# Patient Record
Sex: Male | Born: 1980 | Race: Black or African American | Hispanic: No | Marital: Single | Smoking: Current every day smoker
Health system: Southern US, Community
[De-identification: ages and names within clinical notes are randomized; demographics above are authoritative.]

## PROBLEM LIST (undated history)

## (undated) DIAGNOSIS — I319 Disease of pericardium, unspecified: Secondary | ICD-10-CM

## (undated) DIAGNOSIS — F1910 Other psychoactive substance abuse, uncomplicated: Secondary | ICD-10-CM

## (undated) HISTORY — PX: FETAL SURGERY FOR CONGENITAL HERNIA: SHX1618

## (undated) HISTORY — DX: Other psychoactive substance abuse, uncomplicated: F19.10

---

## 2001-08-23 ENCOUNTER — Emergency Department (HOSPITAL_COMMUNITY): Admission: EM | Admit: 2001-08-23 | Discharge: 2001-08-23 | Payer: Self-pay

## 2005-03-07 ENCOUNTER — Emergency Department (HOSPITAL_COMMUNITY): Admission: EM | Admit: 2005-03-07 | Discharge: 2005-03-07 | Payer: Self-pay | Admitting: Emergency Medicine

## 2006-11-14 ENCOUNTER — Emergency Department (HOSPITAL_COMMUNITY): Admission: EM | Admit: 2006-11-14 | Discharge: 2006-11-14 | Payer: Self-pay | Admitting: Emergency Medicine

## 2007-02-25 ENCOUNTER — Emergency Department (HOSPITAL_COMMUNITY): Admission: EM | Admit: 2007-02-25 | Discharge: 2007-02-25 | Payer: Self-pay | Admitting: Emergency Medicine

## 2007-09-01 ENCOUNTER — Ambulatory Visit: Payer: Self-pay | Admitting: Internal Medicine

## 2007-11-19 ENCOUNTER — Ambulatory Visit: Payer: Self-pay | Admitting: *Deleted

## 2007-12-06 ENCOUNTER — Emergency Department (HOSPITAL_COMMUNITY): Admission: EM | Admit: 2007-12-06 | Discharge: 2007-12-06 | Payer: Self-pay | Admitting: Emergency Medicine

## 2007-12-08 ENCOUNTER — Emergency Department (HOSPITAL_COMMUNITY): Admission: EM | Admit: 2007-12-08 | Discharge: 2007-12-08 | Payer: Self-pay | Admitting: Emergency Medicine

## 2007-12-09 ENCOUNTER — Emergency Department (HOSPITAL_COMMUNITY): Admission: EM | Admit: 2007-12-09 | Discharge: 2007-12-09 | Payer: Self-pay | Admitting: Emergency Medicine

## 2009-06-28 ENCOUNTER — Emergency Department (HOSPITAL_COMMUNITY): Admission: EM | Admit: 2009-06-28 | Discharge: 2009-06-28 | Payer: Self-pay | Admitting: Emergency Medicine

## 2011-02-01 LAB — DIFFERENTIAL
Basophils Absolute: 0 10*3/uL (ref 0.0–0.1)
Basophils Relative: 0 % (ref 0–1)
Eosinophils Absolute: 0 10*3/uL (ref 0.0–0.7)
Eosinophils Relative: 1 % (ref 0–5)
Lymphocytes Relative: 31 % (ref 12–46)
Lymphs Abs: 1.4 10*3/uL (ref 0.7–4.0)
Monocytes Absolute: 0.5 10*3/uL (ref 0.1–1.0)
Monocytes Relative: 12 % (ref 3–12)
Neutro Abs: 2.5 10*3/uL (ref 1.7–7.7)
Neutrophils Relative %: 55 % (ref 43–77)

## 2011-02-01 LAB — CBC
HCT: 42.2 % (ref 39.0–52.0)
Hemoglobin: 13.9 g/dL (ref 13.0–17.0)
MCHC: 33 g/dL (ref 30.0–36.0)
MCV: 81.8 fL (ref 78.0–100.0)
Platelets: 158 10*3/uL (ref 150–400)
RBC: 5.16 MIL/uL (ref 4.22–5.81)
RDW: 15.3 % (ref 11.5–15.5)
WBC: 4.4 10*3/uL (ref 4.0–10.5)

## 2011-02-01 LAB — RAPID STREP SCREEN (MED CTR MEBANE ONLY): Streptococcus, Group A Screen (Direct): NEGATIVE

## 2011-02-01 LAB — MONONUCLEOSIS SCREEN: Mono Screen: NEGATIVE

## 2011-06-25 ENCOUNTER — Emergency Department (HOSPITAL_COMMUNITY)
Admission: EM | Admit: 2011-06-25 | Discharge: 2011-06-25 | Disposition: A | Discharge status: Home / Self Care | Payer: Self-pay | Attending: Emergency Medicine | Admitting: Emergency Medicine

## 2011-06-25 ENCOUNTER — Emergency Department (HOSPITAL_COMMUNITY): Payer: Self-pay

## 2011-06-25 DIAGNOSIS — R002 Palpitations: Secondary | ICD-10-CM | POA: Insufficient documentation

## 2011-06-25 DIAGNOSIS — F17200 Nicotine dependence, unspecified, uncomplicated: Secondary | ICD-10-CM | POA: Insufficient documentation

## 2011-06-25 DIAGNOSIS — R079 Chest pain, unspecified: Secondary | ICD-10-CM | POA: Insufficient documentation

## 2012-05-17 ENCOUNTER — Emergency Department (HOSPITAL_COMMUNITY)
Admission: EM | Admit: 2012-05-17 | Discharge: 2012-05-17 | Disposition: A | Discharge status: Home / Self Care | Payer: Self-pay | Attending: Emergency Medicine | Admitting: Emergency Medicine

## 2012-05-17 ENCOUNTER — Emergency Department (HOSPITAL_COMMUNITY): Payer: Self-pay

## 2012-05-17 ENCOUNTER — Encounter (HOSPITAL_COMMUNITY): Payer: Self-pay | Admitting: Emergency Medicine

## 2012-05-17 DIAGNOSIS — Y9289 Other specified places as the place of occurrence of the external cause: Secondary | ICD-10-CM | POA: Insufficient documentation

## 2012-05-17 DIAGNOSIS — X500XXA Overexertion from strenuous movement or load, initial encounter: Secondary | ICD-10-CM | POA: Insufficient documentation

## 2012-05-17 DIAGNOSIS — Y9389 Activity, other specified: Secondary | ICD-10-CM | POA: Insufficient documentation

## 2012-05-17 DIAGNOSIS — IMO0002 Reserved for concepts with insufficient information to code with codable children: Secondary | ICD-10-CM | POA: Insufficient documentation

## 2012-05-17 DIAGNOSIS — Y998 Other external cause status: Secondary | ICD-10-CM | POA: Insufficient documentation

## 2012-05-17 MED ORDER — CYCLOBENZAPRINE HCL 10 MG PO TABS: 10.0000 mg | ORAL_TABLET | Freq: Two times a day (BID) | ORAL | Status: AC | PRN

## 2012-05-17 MED ORDER — HYDROCODONE-ACETAMINOPHEN 5-500 MG PO TABS: 1.0000 | ORAL_TABLET | Freq: Four times a day (QID) | ORAL | Status: AC | PRN

## 2012-05-17 NOTE — ED Provider Notes (Signed)
Medical screening examination/treatment/procedure(s) were performed by non-physician practitioner and as supervising physician I was immediately available for consultation/collaboration.  Chenika Nevils, MD 05/17/12 0724 

## 2012-05-17 NOTE — ED Notes (Signed)
The patient is AOx4 and comfortable with his discharge instructions. 

## 2012-05-17 NOTE — ED Notes (Signed)
Patient transported to X-ray 

## 2012-05-17 NOTE — ED Notes (Addendum)
Patient complaining of constant upper back pain since Friday; patient denies injury.  Patient ambulatory in triage; able to move all extremities without difficulty.  Patient denies chest pain, but states that pain worsens upon deep inspiration.  Denies recent cold symptoms.

## 2012-05-17 NOTE — ED Provider Notes (Signed)
History     CSN: [id number]  Arrival date & time 05/17/12  0504   First MD Initiated Contact with Patient 05/17/12 [phone]      Chief Complaint  Patient presents with  . Back Pain    (Consider location/radiation/quality/duration/timing/severity/associated sxs/prior treatment) HPI  And the patient presents to the emergency department with complaints of thoracic back pain. 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 He states he uses a lift truck all day on Thursday when he woke up in the morning on Friday  was very sore. She denies feeling any pain the day before he denies having a history of back problems. He denies having any numbness or tingling in all 4 extremities. He denies having bowel or urinary incontinence. He denies having had a common duct pain, fevers, nausea, vomiting, chills, weakness. History Tony sleeping at night and had to be comfortable because of the pain. He does admit that when he takes a large breath that causes pain in his back and he is concerned about this because his smoker.VSS/NAD   History reviewed. No pertinent past medical history.  Past Surgical History  Procedure Date  . Fetal surgery for congenital hernia     History reviewed. No pertinent family history.  History  Substance Use Topics  . Smoking status: Never Smoker   . Smokeless tobacco: Not on file  . Alcohol Use: No      Review of Systems   HEENT: denies blurry vision or change in hearing PULMONARY: Denies difficulty breathing and SOB CARDIAC: denies chest pain or heart palpitations MUSCULOSKELETAL:  denies being unable to ambulate ABDOMEN AL: denies abdominal pain GU: denies loss of bowel or urinary control NEURO: denies numbness and tingling in extremities SKIN: no new rashes PSYCH: patient denies anxiety or depression. NECK: Pt denies having neck pain     Allergies  Review of patient's allergies indicates  no known allergies.  Home Medications   Current Outpatient Rx  Name Route Sig Dispense Refill  . IBUPROFEN 600 MG PO TABS Oral Take 1,200 mg by mouth once.    . CYCLOBENZAPRINE HCL 10 MG PO TABS Oral Take 1 tablet (10 mg total) by mouth 2 (two) times daily as needed for muscle spasms. 20 tablet 0  . HYDROCODONE-ACETAMINOPHEN 5-500 MG PO TABS Oral Take 1 tablet by mouth every 6 (six) hours as needed for pain. 10 tablet 0    BP 130/86  Pulse 66  Temp 97.6 F (36.4 C) (Oral)  Resp 18  SpO2 100%  Physical Exam  Nursing note and vitals reviewed. Constitutional: He appears well-developed and well-nourished. No distress.  HENT:  Head: Normocephalic and atraumatic.  Eyes: Pupils are equal, round, and reactive to light.  Neck: Normal range of motion. Neck supple.  Cardiovascular: Normal rate and regular rhythm.   Pulmonary/Chest: Effort normal.  Abdominal: Soft.  Musculoskeletal:       Thoracic back: He exhibits tenderness (mild tenderness). He exhibits normal range of motion, no bony tenderness and no swelling.        Equal strength to bilateral lower and upper extremities. Neurosensory  function adequate to both legs. Skin color is normal. Skin is warm and moist. I see no step off deformity, no bony tenderness. Pt is able to ambulate without limp. Pain is relieved when sitting in certain positions. ROM is decreased due to pain. No crepitus, laceration, effusion, swelling.  Pulses are normal  Neurological: He is alert.  Skin: Skin is warm and dry.    ED Course  Procedures (including critical care time)  Labs Reviewed - No data to display Dg Chest 2 View  05/17/2012  *RADIOLOGY REPORT*  Clinical Data: Chest wall pain and upper back pain since Friday.  CHEST - 2 VIEW  Comparison: 06/25/2011  Findings: Normal heart size and pulmonary vascularity.  No focal airspace consolidation in the lungs.  No blunting of costophrenic angles.  No pneumothorax.  Visualized ribs appear intact.  No  paraspinal soft tissue swelling.  No significant change since previous study.  IMPRESSION: No evidence of active pulmonary disease.  Original Report Authenticated By: Marlon Pel, M.D.     1. Back sprain or strain       MDM  Patient with back pain. No neurological deficits. Patient is ambulatory. No warning symptoms of back pain including: loss of bowel or bladder control, night sweats, waking from sleep with back pain, unexplained fevers or weight loss, h/o cancer, IVDU, recent trauma. No concern for cauda equina, epidural abscess, or other serious cause of back pain. Conservative measures such as rest, ice/heat and pain medicine indicated with PCP follow-up if no improvement with conservative management.           Dorthula Matas, PA 05/17/12 [phone]

## 2013-01-24 ENCOUNTER — Emergency Department (HOSPITAL_COMMUNITY): Payer: 59

## 2013-01-24 ENCOUNTER — Emergency Department (HOSPITAL_COMMUNITY)
Admission: EM | Admit: 2013-01-24 | Discharge: 2013-01-24 | Disposition: A | Discharge status: Home / Self Care | Payer: 59 | Attending: Emergency Medicine | Admitting: Emergency Medicine

## 2013-01-24 ENCOUNTER — Encounter (HOSPITAL_COMMUNITY): Payer: Self-pay | Admitting: Emergency Medicine

## 2013-01-24 DIAGNOSIS — I309 Acute pericarditis, unspecified: Secondary | ICD-10-CM | POA: Insufficient documentation

## 2013-01-24 DIAGNOSIS — F17200 Nicotine dependence, unspecified, uncomplicated: Secondary | ICD-10-CM | POA: Insufficient documentation

## 2013-01-24 DIAGNOSIS — R002 Palpitations: Secondary | ICD-10-CM | POA: Insufficient documentation

## 2013-01-24 DIAGNOSIS — Z87828 Personal history of other (healed) physical injury and trauma: Secondary | ICD-10-CM | POA: Insufficient documentation

## 2013-01-24 LAB — CBC
HCT: 40.4 % (ref 39.0–52.0)
Hemoglobin: 13.7 g/dL (ref 13.0–17.0)
MCH: 26.3 pg (ref 26.0–34.0)
MCV: 77.7 fL — ABNORMAL LOW (ref 78.0–100.0)
Platelets: 229 10*3/uL (ref 150–400)
RBC: 5.2 MIL/uL (ref 4.22–5.81)
WBC: 4.9 10*3/uL (ref 4.0–10.5)

## 2013-01-24 LAB — BASIC METABOLIC PANEL
BUN: 20 mg/dL (ref 6–23)
CO2: 27 mEq/L (ref 19–32)
Calcium: 9.4 mg/dL (ref 8.4–10.5)
Chloride: 102 mEq/L (ref 96–112)
Creatinine, Ser: 1.24 mg/dL (ref 0.50–1.35)
GFR calc Af Amer: 87 mL/min — ABNORMAL LOW (ref >90)
Glucose, Bld: 95 mg/dL (ref 70–99)

## 2013-01-24 LAB — PRO B NATRIURETIC PEPTIDE: Pro B Natriuretic peptide (BNP): 36.5 pg/mL (ref 0–125)

## 2013-01-24 MED ORDER — COLCHICINE 0.6 MG PO TABS: 0.6000 mg | ORAL_TABLET | Freq: Two times a day (BID) | ORAL | Status: DC

## 2013-01-24 MED ORDER — IBUPROFEN 800 MG PO TABS
800.0000 mg | ORAL_TABLET | Freq: Once | ORAL | Status: AC
  Administered 2013-01-24: 800 mg via ORAL
  Filled 2013-01-24: qty 1

## 2013-01-24 MED ORDER — INDOMETHACIN 25 MG PO CAPS: 25.0000 mg | ORAL_CAPSULE | Freq: Three times a day (TID) | ORAL | Status: DC | PRN

## 2013-01-24 NOTE — ED Provider Notes (Signed)
History     CSN: [id number]  Arrival date & time 01/24/13  0830   First MD Initiated Contact with Patient 01/24/13 [phone]      Chief Complaint  Patient presents with  . Chest Pain    (Consider location/radiation/quality/duration/timing/severity/associated sxs/prior treatment) HPI  32 year old male presents complaining of chest discomfort. Patient reports he has had intermittent discomforts to his left upper chest for the past 2-3 days.  Describe discomfort as an achy pain sensation with occasional sharp shooting pain. Pain also associated with occasional heart palpitation. There has been no associated recent trauma, no exertional component, no nausea, or shortness of breath. He has had similar episode like this in the past, lasting for several days and has followup with a doctor who recommend for him to quit smoking. He has tried to quit smoking cigarettes. He denies family history of premature cardiac death, hypertension, diabetes. Patient denies exertional syncope. no specific treatment tried. Prior history of cocaine use but denies any recent use of cocaine.  Pt also reports unexplained weight loss of 10-15 lbs in the past 2 months, night sweat and body aches.  No significant family hx of cancer.    History reviewed. No pertinent past medical history.  Past Surgical History  Procedure Laterality Date  . Fetal surgery for congenital hernia      No family history on file.  History  Substance Use Topics  . Smoking status: Current Every Day Smoker -- 1.00 packs/day    Types: Cigarettes  . Smokeless tobacco: Not on file  . Alcohol Use: Yes      Review of Systems  Constitutional:       A complete 10 system review of systems was obtained and all systems are negative except as noted in the HPI and PMH.    Allergies  Review of patient's allergies indicates no known allergies.  Home Medications  No current outpatient prescriptions on file.  BP 108/66  Pulse 84  Temp(Src)  97.8 F (36.6 C) (Oral)  Resp 16  SpO2 98%  Physical Exam  Nursing note and vitals reviewed. Constitutional: He appears well-developed and well-nourished. No distress.  Awake, alert, nontoxic appearance  Muscularly built  HENT:  Head: Atraumatic.  Eyes: Conjunctivae are normal. Right eye exhibits no discharge. Left eye exhibits no discharge.  Neck: Normal range of motion. Neck supple.  Cardiovascular: Normal rate and regular rhythm.  Exam reveals no gallop and no friction rub.   No murmur heard. Pulmonary/Chest: Effort normal. No respiratory distress. He exhibits no tenderness.  Abdominal: Soft. There is no tenderness. There is no rebound.  Musculoskeletal: He exhibits no tenderness.  ROM appears intact, no obvious focal weakness  Neurological: He is alert.  Skin: Skin is warm and dry. No rash noted.  Psychiatric: He has a normal mood and affect.    ED Course  Procedures (including critical care time)   Date: 01/24/2013  Rate: 76  Rhythm: normal sinus rhythm  QRS Axis: normal  Intervals: normal  ST/T Wave abnormalities: early repolarization  Conduction Disutrbances:none  Narrative Interpretation: acute pericarditis  Old EKG Reviewed: changes noted    10:12 AM Patient presents with left-sided chest pain. ECG shows evidence of pericarditis. He has a normal chest x-ray,  and negative troponin. Normal CBC a normal BMP. Is afebrile and vital signs stable. Patient would likely benefit from further evaluation by her primary care Dr to r/o causes of pericarditis. NSAIDs and colchicine prescribed for pain.  Return precautions discussed.  Care discussed  with attending  Labs Reviewed  CBC - Abnormal; Notable for the following:    MCV 77.7 (*)    All other components within normal limits  BASIC METABOLIC PANEL  PRO B NATRIURETIC PEPTIDE  POCT I-STAT TROPONIN I   Dg Chest 2 View  01/24/2013  *RADIOLOGY REPORT*  Clinical Data: Chest pain, palpitations, tachycardia  CHEST - 2  VIEW  Comparison:  05/17/2012  Findings:  The heart size and mediastinal contours are within normal limits.  Both lungs are clear.  The visualized skeletal structures are unremarkable.  IMPRESSION: No active cardiopulmonary disease.   Original Report Authenticated By: Judie Petit. Shick, M.D.      1. Acute pericarditis       MDM  BP 108/66  Pulse 84  Temp(Src) 97.8 F (36.6 C) (Oral)  Resp 16  SpO2 98%  I have reviewed nursing notes and vital signs. I personally reviewed the imaging tests through PACS system  I reviewed available ER/hospitalization records thought the EMR         Fayrene Helper, [place] 01/24/13 1028

## 2013-01-24 NOTE — ED Notes (Addendum)
C/o constant aching pain to L side of chest with intermittent stabbing pain.  Also reports fatigue, sob, dizziness, nausea, and diaphoresis.  LUQ tenderness with palpation since 4am.  Denies cough.  States he had a cough a couple weeks ago when wife had pneumonia.

## 2013-01-25 NOTE — ED Provider Notes (Signed)
Medical screening examination/treatment/procedure(s) were performed by non-physician practitioner and as supervising physician I was immediately available for consultation/collaboration.  Chyrl Elwell T Vora Clover, MD 01/25/13 1959 

## 2013-01-27 ENCOUNTER — Ambulatory Visit: Payer: 59 | Admitting: Internal Medicine

## 2013-01-27 DIAGNOSIS — Z0289 Encounter for other administrative examinations: Secondary | ICD-10-CM

## 2013-03-01 ENCOUNTER — Ambulatory Visit (INDEPENDENT_AMBULATORY_CARE_PROVIDER_SITE_OTHER): Payer: 59 | Admitting: Family Medicine

## 2013-03-01 ENCOUNTER — Encounter: Payer: Self-pay | Admitting: Family Medicine

## 2013-03-01 VITALS — BP 129/82 | HR 64 | Ht 69.0 in | Wt 153.0 lb

## 2013-03-01 DIAGNOSIS — B351 Tinea unguium: Secondary | ICD-10-CM

## 2013-03-01 DIAGNOSIS — Z7189 Other specified counseling: Secondary | ICD-10-CM

## 2013-03-01 DIAGNOSIS — L600 Ingrowing nail: Secondary | ICD-10-CM

## 2013-03-01 DIAGNOSIS — I309 Acute pericarditis, unspecified: Secondary | ICD-10-CM | POA: Insufficient documentation

## 2013-03-01 MED ORDER — IBUPROFEN 600 MG PO TABS: 600.0000 mg | ORAL_TABLET | Freq: Three times a day (TID) | ORAL | Status: DC

## 2013-03-01 MED ORDER — COLCHICINE 0.6 MG PO TABS: 0.6000 mg | ORAL_TABLET | Freq: Two times a day (BID) | ORAL | Status: DC

## 2013-03-01 NOTE — Patient Instructions (Addendum)
Thank you for coming in, today! It was nice to meet you. For your pericarditis:  We have a few options that we discussed, but we should treat your pericarditis first  We will try treating you with colchicine and an NSAID ("nonsteroidal anti-inflammatory drug")  You should take colchicine 0.6 mg tablets twice per day for the next 6 weeks.  You should also take ibuprofen (Motrin, Advil) 600 mg three times a day for 1 week, then as needed. For your toe:  Since this has been going on for a while, and you've tried lots of things for your nail, we can remove it  You can make an appointment to have the toenail removed at your convenience.  This can specifically be in our "procedure clinic" or can be with me. For smoking:  You can call 1-800-QUIT-NOW, for free assistance on stopping smoking.  I will also refer you to one of our "health coaches" who will call you to talk about stopping smoking.  We will continue to talk about smoking at your visits with me. Our pharmacy clinic may also be able to help. Make an appointment to come back to see me in about 6 weeks, or sooner if you are still having trouble. Please feel free to call with any questions or concerns at any time, at [phone]. --Dr. Sharmon Revere office staff: Please make an appointment for Mr. Sadiq to have his toenail removed.  This can be with me (30 minute block) or in procedure clinic, per his choice.

## 2013-03-01 NOTE — Progress Notes (Signed)
  Subjective:    Patient ID: Sean Khan, male    DOB: 08/23/1980, 32 y.o.   MRN: [id number]  HPI: Pt is a 32yo male who presents to clinic for ED f/u for pericarditis and to establish care. Relevant sections of the EMR updated: PMH, surgical Hx, FH, SH, meds, allergies.  Pericarditis: Pt seen in the ED on 3/30 and diagnosed with pericarditis (2-3 days of chest discomfort, aching with occasional sharp pains, without recent trauma, exertional component, or SOB). Pt was prescribed colchicine and indomethacin but did not fill either prescription (pt stated he "prefered not to take medicines" until he was seen by another doctor). Pt continues to have occasional similar sensation and describes the sensation as "discomfort more than pain." Does not seem to be affected by position or time of day, or to be getting worse; may be some better. Pt does endorse some "fluttering" of heartbeat occasionally, as well as some rare "tightness" in his chest or "pulling pain" under his arm that stops activity when it happens (only ~4-5x in the past 6 months). Pt has never been seen by a cardiologist. See tobacco/substance use below.  Ingrown/infected toenail: Pt complains of a painful, thickened toenail to his right great toe. Approximately one year ago, pt kicked under a box edge at work and thinks he bent the nail back. Since then, the nail has slowly become more painful and partially ingrown on one side, and has become much thicker than his other nails. He has soaked the nail in the past and used various OTC antifungal medications. He has noticed no redness, bleeding, or drainage around the nail now or in the recent past. Pt is interested in having the nail removed.  Tobacco/substance use: Pt is a current every-day smoker, ~0.5 ppd. He has tried quitting in the past and is interested in quitting. Pt has used cocaine in the past, "off and on"; last use was about two months ago. Pt occasionally drinks EtOH, perhaps 1  drink/month.  Other SH: Pt works at [employer] (subsidiary of [employer] Pt is married, sexually active with his wife, has one 2yo daughter; wife has had a hysterectomy.   Pt reports no other significant PMH or surgeries.  Review of Systems: As above.     Objective:   Physical Exam BP 129/82  Pulse 64  Ht 5\' 9"  (1.753 m)  Wt 153 lb (69.4 kg)  BMI 22.58 kg/m2 Gen: well-appearing adult male in NAD HEENT: [place]/AT, sclerae and conjunctivae clear, MMM Neck: Supple, no lymphadenopathy appreciated, full ROM Cardiac: RRR, no murmur or rub appreciated; PMI not displaced, no palpable thrills  Some slight variation in heart rate with breathing but no frank irregularity of rhythm  Lying back for exam caused or correlated with some discomfort/subjective "fluttering"   Sitting forward seemed to alleviate some discomfort Pulm: CTAB, no wheezes, normal WOB Abd: soft, nontender, BS+ Ext: generally warm/well-perfused without cyanosis/clubbing  Nails to both feet with some thickening but no frank redness, bleeding, or discharge noted  Right great toe nail very thick (~1/8-1/4 inch), yellowed, curved slightly into nail bed on the lateral edge Skin: warm, dry, no frank rashes     Assessment & Plan:

## 2013-03-02 ENCOUNTER — Encounter: Payer: Self-pay | Admitting: Family Medicine

## 2013-03-02 ENCOUNTER — Ambulatory Visit: Payer: 59 | Admitting: Family Medicine

## 2013-03-02 DIAGNOSIS — Z7189 Other specified counseling: Secondary | ICD-10-CM | POA: Insufficient documentation

## 2013-03-02 DIAGNOSIS — B351 Tinea unguium: Secondary | ICD-10-CM | POA: Insufficient documentation

## 2013-03-02 NOTE — Assessment & Plan Note (Addendum)
A: Pt is a current every-day smoker and has a history of cocaine abuse and marijuana use. Pt is interested in quitting smoking and understands that cocaine use can directly affect his heart, which was a major concern in today's visit; pt denies recent use.  P: Counseled pt on completely stopping cocaine use, and provided pt with the 1-800-QUIT-NOW number for smoking cessation. Will also refer pt to [facility] health coaching. Will continue to stress cessation of tobacco and marijuana at future visits, and particularly of cocaine.

## 2013-03-02 NOTE — Assessment & Plan Note (Signed)
A: Diagnosed 3/30 in the ED; EKG reviewed, some scattered ST elevation, along with clinical history suggestive of pericarditis, with POC troponin negative and other labs relatively unremarkable at that time. Pt has not to date filled or used colchicine or NSAID from that visit. Continues with some similar symptoms (see HPI) and some intermittent subjective "fluttering" of heart, as well as rare, 1/month or less occurrence of some chest tightness/pain. Hx also significant for current tobacco and hx of cocaine abuse. DDx could also include intermittent arrhythmia.  P: Discussed several options with Dr. Michiel Sites and with pt. Will plan for colchicine 0.6 mg for 6 weeks, Motrin 600 mg TID scheduled for 1 week, then as needed, and reassess pt in 4-6 weeks. Given current untreated pericarditis and rarity of other chest symptoms, Holter monitor seems of less use. However, if pt remains with these symptoms, will consider referral to cardiology for consideration of loop recorder placement and/or further work-up for ischemic disease.

## 2013-03-02 NOTE — Assessment & Plan Note (Signed)
A: Very thickened and partially ingrown right great toenail. Pt has tried various OTC and home treatments in the past and is interested in having the nail removed. No evidence for bacterial superinfection or related cellulitis, and no systemic signs/symptoms.  P: Will plan for pt to follow up specifically for a nail removal visit. Will likely treat with a topical antifungal without steroid and will consider combination oral/systemic antifungal, as well.

## 2013-03-05 ENCOUNTER — Ambulatory Visit: Payer: 59 | Admitting: Family Medicine

## 2013-05-24 ENCOUNTER — Ambulatory Visit (INDEPENDENT_AMBULATORY_CARE_PROVIDER_SITE_OTHER): Payer: 59 | Admitting: Family Medicine

## 2013-05-24 ENCOUNTER — Encounter: Payer: Self-pay | Admitting: Family Medicine

## 2013-05-24 VITALS — BP 106/65 | HR 72 | Temp 98.3°F | Wt 145.0 lb

## 2013-05-24 DIAGNOSIS — L600 Ingrowing nail: Secondary | ICD-10-CM

## 2013-05-24 DIAGNOSIS — B351 Tinea unguium: Secondary | ICD-10-CM

## 2013-05-24 LAB — POCT SKIN KOH: Skin KOH, POC: POSITIVE

## 2013-05-24 NOTE — Patient Instructions (Signed)
We are going to send your nail for culture and to confirm it is a fungus. If it is a fungus, I am going to discuss with Dr. Casper Harrison whether we want to treat you with Lamisil first and/or remove the nail.   Thanks, Dr. Durene Cal

## 2013-05-25 NOTE — Progress Notes (Signed)
  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 Injured nail kciking something at work 1-2 years ago.  Comes today as pain has worsened and wants to do something about the toe. Now wearing a size bigger shoe and still having pain from it rubbing on top of shoe. Hurts mainly at the base of the nail at the cuticle  (moreso at lateral base). Very slight redness at that area but tenderness greater. No fevers/chils/drainage. Nail diffusely thickened and seems to thicken more each month. He is interested in potential removal.   ROS--See HPI  Past Medical History-history of pericarditis Reviewed problem list.  Medications- reviewed and updated Chief complaint-noted  Objective: BP 106/65  Pulse 72  Temp(Src) 98.3 F (36.8 C) (Oral)  Wt 145 lb (65.772 kg)  BMI 21.4 kg/m2 Gen: NAD, resting comfortably Skin: warm, dry Neuro: grossly normal, moves all extremities Ext: no edema, 1st big toe nail thickened to at least 5mm throughout, very slight erythema at base of toe nail, no obvious discharge, some pain with palpation at   Results for orders placed in visit on 05/24/13 (from the past 24 hour(s))  POCT SKIN KOH     Status: Abnormal   Collection Time    05/24/13 12:30 PM      Result Value Range   Skin KOH, POC Positive     Assessment/Plan:

## 2013-05-25 NOTE — Assessment & Plan Note (Signed)
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 Have discussed case with Dr. Casper Harrison and plan will be to discuss with patient. Can trial lamisil vs. Remove toenail and use phenol vs. Remove toenail and use lamisil. I have attempted to reach patient by phone to discuss but will reattempt later today/tomorrow. Preference would be for this to be done at a procedure clinic with Dr. Casper Harrison but also procedure clinic is an option.

## 2013-05-27 ENCOUNTER — Encounter: Payer: Self-pay | Admitting: Family Medicine

## 2013-06-01 ENCOUNTER — Ambulatory Visit (INDEPENDENT_AMBULATORY_CARE_PROVIDER_SITE_OTHER): Payer: 59 | Admitting: Family Medicine

## 2013-06-01 ENCOUNTER — Encounter: Payer: Self-pay | Admitting: Family Medicine

## 2013-06-01 VITALS — BP 131/75 | HR 99 | Ht 68.0 in | Wt 142.0 lb

## 2013-06-01 DIAGNOSIS — L600 Ingrowing nail: Secondary | ICD-10-CM

## 2013-06-01 DIAGNOSIS — B351 Tinea unguium: Secondary | ICD-10-CM

## 2013-06-01 MED ORDER — TERBINAFINE HCL 250 MG PO TABS: 250.0000 mg | ORAL_TABLET | Freq: Every day | ORAL | Status: DC

## 2013-06-01 NOTE — Progress Notes (Signed)
  Subjective:    Patient ID: Sean Khan, male    DOB: 08/23/1980, 32 y.o.   MRN: [id number]  HPI: Pt presents to clinic for removal of right great toenail. Seen recently by Dr. Durene Cal, nail has been thickened, painful for "a couple of years," and pt has elected to have it removed. States he has been "dealing" with the nail for several months and initially did not want to treat with medication alone and then possibly have to have nail removed later, anyway. No bleeding, drainage, swelling of toe or foot, no rash or streaking of skin, no fevers/chills or systemic symptoms.  Review of Systems: As above.     Objective:   Physical Exam BP 131/75  Pulse 99  Ht 5\' 8"  (1.727 m)  Wt 142 lb (64.411 kg)  BMI 21.6 kg/m2 Right great toe - Nail very thick, dark gray-greenish, with very slight ingrowth to lateral edge of nail  No erythema, bleeding, drainage to suggest frank bacterial infection or abscess  Nail itself appears mostly detached from underlying bed, halfway from distal end of nail back toward proximal end     Assessment & Plan:  Pt consented for full removal of toenail. Procedure precepted with Dr. Leveda Anna. Reassessed with Dr. Leveda Anna and decision made to trim nail down, then treat with with 12 weeks of terbinafine. Pt in agreement. Nail trimmed and instructions given to f/u in 6 weeeks. See problem list note.

## 2013-06-01 NOTE — Patient Instructions (Addendum)
Thank you for coming in, today! Dr. Leveda Anna helped trim your nail down. We are prescribing a medicine called terbinafine to take once a day for 3 months. You can get this medication at [facility] cheap. Take the medicine around the same time every day. If you miss a dose, take it when you remember, unless your next dose would be in less than four hours. Make an appointment to see me in about a month and a half to see how your toe is doing. If you're still having problems at that point, we can re-discuss removing the nail. Please feel free to call with any questions or concerns at any time, at [phone]. --Dr. Casper Harrison

## 2013-06-01 NOTE — Assessment & Plan Note (Signed)
A: Confirmed onychomychosis at last visit with Dr. Durene Cal by KOH test. Originally planned full toenail removal with phenol ablation today, but reassessed with Dr. Leveda Anna and elected for trimming nail and terbinafine for 12 weeks. Nail trimmed today.  P: Rx for terbinafine 250 mg daily for 12 weeks. Reviewed proper dosing of medications. F/u in 6 weeks to reassess toe. If no improvement or problems with medication, will reconsider removal of nail. Appreciate Dr. Cyndia Skeeters assistance.

## 2013-07-16 ENCOUNTER — Ambulatory Visit: Payer: 59 | Admitting: Family Medicine

## 2013-07-23 ENCOUNTER — Ambulatory Visit: Payer: 59 | Admitting: Family Medicine

## 2013-11-22 ENCOUNTER — Emergency Department (HOSPITAL_COMMUNITY)
Admission: EM | Admit: 2013-11-22 | Discharge: 2013-11-22 | Disposition: A | Discharge status: Home / Self Care | Payer: 59 | Attending: Emergency Medicine | Admitting: Emergency Medicine

## 2013-11-22 ENCOUNTER — Encounter (HOSPITAL_COMMUNITY): Payer: Self-pay | Admitting: Emergency Medicine

## 2013-11-22 DIAGNOSIS — K029 Dental caries, unspecified: Secondary | ICD-10-CM | POA: Insufficient documentation

## 2013-11-22 DIAGNOSIS — K0889 Other specified disorders of teeth and supporting structures: Secondary | ICD-10-CM

## 2013-11-22 DIAGNOSIS — Z8679 Personal history of other diseases of the circulatory system: Secondary | ICD-10-CM | POA: Insufficient documentation

## 2013-11-22 DIAGNOSIS — F1410 Cocaine abuse, uncomplicated: Secondary | ICD-10-CM | POA: Insufficient documentation

## 2013-11-22 DIAGNOSIS — F17200 Nicotine dependence, unspecified, uncomplicated: Secondary | ICD-10-CM | POA: Insufficient documentation

## 2013-11-22 DIAGNOSIS — K089 Disorder of teeth and supporting structures, unspecified: Secondary | ICD-10-CM | POA: Insufficient documentation

## 2013-11-22 DIAGNOSIS — Z79899 Other long term (current) drug therapy: Secondary | ICD-10-CM | POA: Insufficient documentation

## 2013-11-22 HISTORY — DX: Disease of pericardium, unspecified: I31.9

## 2013-11-22 MED ORDER — PENICILLIN V POTASSIUM 250 MG PO TABS
500.0000 mg | ORAL_TABLET | Freq: Once | ORAL | Status: AC
  Administered 2013-11-22: 500 mg via ORAL
  Filled 2013-11-22: qty 2

## 2013-11-22 MED ORDER — PENICILLIN V POTASSIUM 500 MG PO TABS: 500.0000 mg | ORAL_TABLET | Freq: Four times a day (QID) | ORAL | Status: DC

## 2013-11-22 MED ORDER — TRAMADOL HCL 50 MG PO TABS
50.0000 mg | ORAL_TABLET | Freq: Once | ORAL | Status: AC
  Administered 2013-11-22: 50 mg via ORAL
  Filled 2013-11-22: qty 1

## 2013-11-22 MED ORDER — TRAMADOL HCL 50 MG PO TABS: 50.0000 mg | ORAL_TABLET | Freq: Four times a day (QID) | ORAL | Status: DC | PRN

## 2013-11-22 NOTE — ED Notes (Signed)
Called no answer x1

## 2013-11-22 NOTE — ED Notes (Signed)
Last lower molar right side has dental cavity.  Pt reports pain started worsening last week.  Rates pain 8/10.

## 2013-11-22 NOTE — ED Provider Notes (Signed)
CSN: [id number]     Arrival date & time 11/22/13  1939 History  This chart was scribed for non-physician practitioner, Emilia BeckKaitlyn Brytney Somes, PA-C working with Gavin PoundMichael Y. Oletta LamasGhim, MD by Greggory StallionKayla Andersen, ED scribe. This patient was seen in room TR07C/TR07C and the patient's care was started at 9:09 PM.    Chief Complaint  Patient presents with  . Dental Pain   The history is provided by the patient. No language interpreter was used.   HPI Comments: Sean Khan is a 33 y.o. male who presents to the Emergency Department complaining of gradual onset, constant, left upper dental pain that started 1.5 weeks ago ago. States the pain has worsened over the last few days. He has taken Advil with no relief. Denies fever. Pt does not have a dentist that he sees regularly.   Past Medical History  Diagnosis Date  . Substance abuse     Tobacco, cocaine  . Pericarditis    Past Surgical History  Procedure Laterality Date  . Fetal surgery for congenital hernia     No family history on file. History  Substance Use Topics  . Smoking status: Current Every Day Smoker -- 1.00 packs/day    Types: Cigarettes  . Smokeless tobacco: Never Used  . Alcohol Use: Yes    Review of Systems  Constitutional: Negative for fever.  HENT: Positive for dental problem.   All other systems reviewed and are negative.    Allergies  Review of patient's allergies indicates no known allergies.  Home Medications   Current Outpatient Rx  Name  Route  Sig  Dispense  Refill  . colchicine 0.6 MG tablet   Oral   Take 1 tablet (0.6 mg total) by mouth 2 (two) times daily.   84 tablet   0    BP 129/74  Pulse 74  Temp(Src) 97.8 F (36.6 C) (Oral)  Resp 17  Ht 5\' 7"  (1.702 m)  SpO2 99%  Physical Exam  Nursing note and vitals reviewed. Constitutional: He is oriented to person, place, and time. He appears well-developed and well-nourished. No distress.  HENT:  Head: Normocephalic and atraumatic.  Large cavity in left  lower posterior molar.   Eyes: EOM are normal.  Neck: Neck supple. No tracheal deviation present.  Cardiovascular: Normal rate.   Pulmonary/Chest: Effort normal. No respiratory distress.  Musculoskeletal: Normal range of motion.  Neurological: He is alert and oriented to person, place, and time.  Skin: Skin is warm and dry.  Psychiatric: He has a normal mood and affect. His behavior is normal.    ED Course  Procedures (including critical care time)  DIAGNOSTIC STUDIES: Oxygen Saturation is 99% on RA, normal by my interpretation.    COORDINATION OF CARE: 9:10 PM-Discussed treatment plan which includes pain medication and an antibiotic with pt at bedside and pt agreed to plan. Advised pt to follow up with a dentist.   Labs Review Labs Reviewed - No data to display Imaging Review No results found.  EKG Interpretation   None       MDM   1. Pain, dental    9:17 PM Patient given veetid and tramadol for dental pain. Patient advised to follow up with dentist from the resource guide. Vitals stable and patient afebrile.   I personally performed the services described in this documentation, which was scribed in my presence. The recorded information has been reviewed and is accurate.   Emilia BeckKaitlyn Gerod Caligiuri, [place] JerseyPA-C 11/22/13 2118

## 2013-11-22 NOTE — ED Notes (Signed)
Patient with dental pain for the last week, has esculated in the last few days.  Patient states that the pain is upper left jaw.

## 2013-11-22 NOTE — Discharge Instructions (Signed)
Take Veetid as directed until gone. Take tramadol as needed for pain. Follow up with a dentist from the resource guide.    Emergency Department Resource Guide 1) Find a Doctor and Pay Out of Pocket Although you won't have to find out who is covered by your insurance plan, it is a good idea to ask around and get recommendations. You will then need to call the office and see if the doctor you have chosen will accept you as a new patient and what types of options they offer for patients who are self-pay. Some doctors offer discounts or will set up payment plans for their patients who do not have insurance, but you will need to ask so you aren't surprised when you get to your appointment.  2) Contact Your Local Health Department Not all health departments have doctors that can see patients for sick visits, but many do, so it is worth a call to see if yours does. If you don't know where your local health department is, you can check in your phone book. The CDC also has a tool to help you locate your state's health department, and many state websites also have listings of all of their local health departments.  3) Find a Walk-in Clinic If your illness is not likely to be very severe or complicated, you may want to try a walk in clinic. These are popping up all over the country in pharmacies, drugstores, and shopping centers. They're usually staffed by nurse practitioners or physician assistants that have been trained to treat common illnesses and complaints. They're usually fairly quick and inexpensive. However, if you have serious medical issues or chronic medical problems, these are probably not your best option.  No Primary Care Doctor: - Call Health Connect at  [phone] - they can help you locate a primary care doctor that  accepts your insurance, provides certain services, etc. - Physician Referral Service- [phone]  Chronic Pain Problems: Organization         Address  Phone   Notes  [facility]  [phone] Patients need to be referred by their primary care doctor.   Medication Assistance: Organization         Address  Phone   Notes  [facility] [address] [place], [place] [address] [phone] --Must be a resident of [place] -- Must have NO insurance coverage whatsoever (no Medicaid/ Medicare, etc.) -- The pt. MUST have a primary care doctor that directs their care regularly and follows them in the community   MedAssist  [phone]   [employer]  [phone]    Agencies that provide inexpensive medical care: Organization         Address  Phone   Notes  [facility]  [phone]   [facility]    [phone]   [facility] [address] [place], [place] [address] [phone]   [facility] [address] [place], [place] [phone]   [facility]anned Parenthood    [phone]   [facility]    [phone]   [facility]  [address], [place] Phone:  [phone], Fax:  [phone] Hours of Operation:  9 am - 6 pm, M-F.  Also accepts Medicaid/Medicare and self-pay.  [facility]  [address], [place] Phone: [phone]) [phone],  Fax: [phone]. Hours of Operation:  8:30 am - 5:30 pm, M-F.  Also accepts Medicaid and self-pay.  [facility] [address] [place] Phone: [phone]   [place], [place], [place] [phone], Ext. [phone] Mondays & Thursdays: 7-9 AM.  First 15 patients are seen on a first come, first serve basis.    [place] Providers:  Organization         Address  Phone   Notes  [facility] [address], [place] [phone] Also accepts self-pay patients.  [facility] [id number] [place], [place]  [phone]   [place], [address], [place] [phone]   [facility] [address], [place] [phone]   Lucianne Lei [address], [place]   [phone] Only accepts [place] Access [place] patients after they have their name applied to their card.   Self-Pay (no insurance) in [facility]:  Organization         Address  Phone   Notes  Sickle Cell Patients, [facility] [place] [phone]   [facility] [place] [phone]   [facility] [place]  [place], [place], [place] [phone]   [facility]/Dr. Osei-Bonsu  [address] [place] or [address], [place] [phone] Phone number for both [place] and [place] locations is the same.  Urgent Medical and [facility] [address] [place] [phone]   [facility] [address] [place] or [address] [phone]   [facility] [address], [place] [phone], phone; [phone], fax Sees patients 1st and 3rd Saturday of every month.  Must not qualify for public or private insurance (i.e. Medicaid, Medicare, [place] Health Choice, Veterans' Benefits)  Household income should be no more than 200% of the poverty level The clinic cannot treat you if you are pregnant or think you are pregnant  Sexually transmitted diseases are not treated at the clinic.    Dental Care: Organization         Address  Phone  Notes  [facility] Department of [facility] [place] [phone] Accepts children up to age 15 who are enrolled in [place] or [place]; pregnant women with a Medicaid card; and children who have applied for  Medicaid or [place] Health Choice, but were declined, whose parents can pay a reduced fee at time of service.  [facility] Department of [facility]  [address], [place] [phone] Accepts children up to age 98 who are enrolled in [place] or [place]; pregnant women with a Medicaid card; and children who have applied for Medicaid or [place] Health Choice, but were declined, whose parents can pay a reduced fee at time of service.  [facility]  [place] [phone] Patients are seen by appointment only. Walk-ins are not accepted. [place] will see patients 73 years of age and older. Monday - Tuesday (8am-5pm) Most Wednesdays (8:30-5pm) $30 per visit, cash only  [place] [facility] [employer] PROGRAM  [address], [employer] (  [phone] Patients are seen by appointment only. Walk-ins are not accepted. [place] will see patients 46 years of age and older. One Wednesday Evening (Monthly: Volunteer Based).  $30 per visit, cash only  [place]  [phone] for adults; Children under age 55, call Graduate Pediatric Dentistry at [phone]. Children aged 45-14, please call [phone] to request a pediatric application.  Dental services are provided in all areas of dental care including fillings, crowns and bridges, complete and partial dentures, implants, gum treatment, root canals, and extractions. Preventive care is also provided. Treatment is provided to both adults and children. Patients are selected via a lottery and there is often a waiting list.   [facility] [address] [place]  [phone] [email or web address]   [facility] [address] [place], [place] [phone], Ext. [phone] Second and Fourth Thursday of each month, opens at 6:30 AM; Clinic ends at 9 AM.  Patients are seen on a first-come first-served basis, and a limited number  are seen during each clinic.   [facility]  [address] Hillard Danker [place], [place] [phone]   Eligibility Requirements You must have lived in [place], [place], or [place] counties for at least the last three months.   You cannot be eligible for state or federal sponsored [employer], including [employer], [place], or [employer].   You generally cannot be eligible for healthcare insurance through your employer.    How to apply: Eligibility screenings are held every Tuesday and Wednesday afternoon from 1:00 pm until 4:00 pm. You do not need an appointment for the interview!  [facility] [address], [place], [place]   [place]  [place] Department  [place]  [phone]    Behavioral Health Resources in the Community: Intensive Outpatient Programs Organization         Address  Phone  Notes  [place]. [address], [place], [place] [phone]   [facility] Outpatient [address] [place], [place]   ADS: Alcohol & Drug Svcs [address], [place], [place]   [place] [address],  [place], [place] or [phone]   Substance Abuse Resources Organization         Address  Phone  Notes  Alcohol and Drug Services  [phone]   [place]  [phone]   [facility] [place]   Chinita Pester  [phone]   Residential & Outpatient Substance Abuse Program  [phone]   Psychological Services Organization         Address  Phone  Notes  [facility] [place]  [place]  [phone]   [place] [address] [place] or [phone]    Mobile Crisis Teams Organization         Address  Phone  Notes  Therapeutic  Alternatives, Mobile Crisis Care Unit  [phone]   Assertive Psychotherapeutic Services  [address] [place], [place]   [place] Levels [address], [place] [phone]    Self-Help/Support Groups Organization         Address  Phone             Notes  [place] of [place] - variety of support groups  336- H3156881 Call for more information  Narcotics Anonymous (NA), Caring Services [address], [employer] [place]  2 meetings at this location   Residential Facilities manager         Address  Phone  Notes  [facility] [place],    [place]  [phone])[phone]   [facility]  [address], [place] [phone], [place], [place]   [place], [place] [phone] Admissions: 8am-3pm M-F  Incentives Substance [place] [address]    [place], [place] [phone]   [facility] [address] [place], [place], [place]   [facility] [address],  [place], [place]   [facility]ight Programs - Intensive Outpatient [address] Kristeen [address], [place], [place]   [facility] [place].,  [place], [place] [phone] or [phone]   [facility] Treatment Services (RTS) [address] [place], [place] Accepts Medicaid  [facility] [place] [address],  [place] [phone] Substance Abuse/Addiction Treatment   [facility] Organization         Address  Phone  Notes  [facility]  [phone]   Domenic Schwab, PhD [address] Arlis Porta [place], [place]   [phone] or [phone]   [place], [place] [phone]   [facility] [address] [place], [place] [phone] Insurance/Medicaid/sponsorship through [facility]  [address] [place]                                    [place], [place] [phone] [place] [address], [place] [phone]    Dr. Adele Schilder  [phone]   [facility] Dept. 1) [address] [place] 2) [place] 3)  [address], [place] [phone]  [phone]   [place] [phone] or [phone] (After Hours)

## 2013-11-23 NOTE — ED Provider Notes (Signed)
Medical screening examination/treatment/procedure(s) were performed by non-physician practitioner and as supervising physician I was immediately available for consultation/collaboration.  EKG Interpretation   None         Meenakshi Sazama Y. Chane Magner, MD 11/23/13 2057 

## 2015-06-24 ENCOUNTER — Emergency Department (HOSPITAL_COMMUNITY)
Admission: EM | Admit: 2015-06-24 | Discharge: 2015-06-24 | Disposition: A | Discharge status: Home / Self Care | Payer: Self-pay | Attending: Emergency Medicine | Admitting: Emergency Medicine

## 2015-06-24 ENCOUNTER — Encounter (HOSPITAL_COMMUNITY): Payer: Self-pay

## 2015-06-24 DIAGNOSIS — R51 Headache: Secondary | ICD-10-CM | POA: Insufficient documentation

## 2015-06-24 DIAGNOSIS — Z792 Long term (current) use of antibiotics: Secondary | ICD-10-CM | POA: Insufficient documentation

## 2015-06-24 DIAGNOSIS — R519 Headache, unspecified: Secondary | ICD-10-CM

## 2015-06-24 DIAGNOSIS — Z720 Tobacco use: Secondary | ICD-10-CM | POA: Insufficient documentation

## 2015-06-24 DIAGNOSIS — Z8679 Personal history of other diseases of the circulatory system: Secondary | ICD-10-CM | POA: Insufficient documentation

## 2015-06-24 DIAGNOSIS — F1410 Cocaine abuse, uncomplicated: Secondary | ICD-10-CM | POA: Insufficient documentation

## 2015-06-24 DIAGNOSIS — Z79899 Other long term (current) drug therapy: Secondary | ICD-10-CM | POA: Insufficient documentation

## 2015-06-24 MED ORDER — ACETAMINOPHEN 325 MG PO TABS
650.0000 mg | ORAL_TABLET | Freq: Once | ORAL | Status: AC
  Administered 2015-06-24: 650 mg via ORAL
  Filled 2015-06-24: qty 2

## 2015-06-24 NOTE — ED Provider Notes (Signed)
CSN: [id number]     Arrival date & time 06/24/15  1141 History   First MD Initiated Contact with Patient 06/24/15 1213     Chief Complaint  Patient presents with  . Cocaine Detox   . Headache     (Consider location/radiation/quality/duration/timing/severity/associated sxs/prior Treatment) Patient is a 34 y.o. male presenting with headaches. The history is provided by the patient.  Headache Associated symptoms: no abdominal pain, no back pain, no fever, no neck pain, no numbness, no sore throat and no weakness   Patient w hx cocaine use/abuse, states wants cocaine rehab.  States has been using intermittently x years. Last used yesterday. Also notes intermittent etoh abuse, binge drinking - denies heavy or daily etoh use. Denies other substance abuse.  Pt states when using drugs at times will feel anxious, and others depressed. Denies any thoughts of self harm. Has been eating and drinking normally. Recent health at baseline, no recent illness. Intermittent mild frontal headaches, gradual onset, no severe headaches. No chest pain.      Past Medical History  Diagnosis Date  . Substance abuse     Tobacco, cocaine  . Pericarditis    Past Surgical History  Procedure Laterality Date  . Fetal surgery for congenital hernia     History reviewed. No pertinent family history. Social History  Substance Use Topics  . Smoking status: Current Every Day Smoker -- 1.00 packs/day    Types: Cigarettes  . Smokeless tobacco: Never Used  . Alcohol Use: Yes    Review of Systems  Constitutional: Negative for fever.  HENT: Negative for sore throat.   Eyes: Negative for redness.  Respiratory: Negative for shortness of breath.   Cardiovascular: Negative for chest pain.  Gastrointestinal: Negative for abdominal pain.  Genitourinary: Negative for flank pain.  Musculoskeletal: Negative for back pain and neck pain.  Skin: Negative for rash.  Neurological: Positive for headaches. Negative for speech  difficulty, weakness and numbness.  Hematological: Does not bruise/bleed easily.  Psychiatric/Behavioral: Negative for suicidal ideas.      Allergies  Review of patient's allergies indicates no known allergies.  Home Medications   Prior to Admission medications   Medication Sig Start Date End Date Taking? Authorizing Provider  colchicine 0.6 MG tablet Take 1 tablet (0.6 mg total) by mouth 2 (two) times daily. 03/01/13   Stephanie Coup Street, MD  penicillin v potassium (VEETID) 500 MG tablet Take 1 tablet (500 mg total) by mouth 4 (four) times daily. 11/22/13   Kaitlyn Szekalski, PA-C  traMADol (ULTRAM) 50 MG tablet Take 1 tablet (50 mg total) by mouth every 6 (six) hours as needed. 11/22/13   Kaitlyn Szekalski, PA-C   BP 128/87 mmHg  Pulse 84  Temp(Src) 98.4 F (36.9 C) (Oral)  Resp 16  SpO2 98% Physical Exam  Constitutional: He is oriented to person, place, and time. He appears well-developed and well-nourished. No distress.  HENT:  Head: Atraumatic.  No sinus or temporal tenderness.   Eyes: Conjunctivae are normal. No scleral icterus.  Neck: Neck supple. No tracheal deviation present.  No stiffness or rigidity  Cardiovascular: Normal rate, regular rhythm, normal heart sounds and intact distal pulses.  Exam reveals no gallop and no friction rub.   No murmur heard. Pulmonary/Chest: Effort normal and breath sounds normal. No accessory muscle usage. No respiratory distress.  Abdominal: He exhibits no distension. There is no tenderness.  Musculoskeletal: Normal range of motion. He exhibits no edema.  Neurological: He is alert and oriented to person,  place, and time.  Speech clear/fluent. Ambulates w steady gait.   Skin: Skin is warm and dry. He is not diaphoretic.  Psychiatric: He has a normal mood and affect.  Nursing note and vitals reviewed.   ED Course  Procedures (including critical care time) Labs Review     MDM    Reviewed nursing notes and prior charts for  additional history.   Pt notes when using drugs, occasional feelings of anxiety and/or depression. Denies severe depression or any thoughts of self harm.   Pt w slight headache. Tylenol po.   Will provide resource guide re SA, and pcp referral.     Cathren Laine, MD 06/24/15 [phone]

## 2015-06-24 NOTE — Discharge Instructions (Signed)
It was our pleasure to provide your ER care today - we hope that you feel better.  Avoid cocaine and/or alcohol use - see resource guide provided for community substance abuse resources.  For routine medical care, follow up with primary care provider - see referral.  For mental health issues and/or crisis (i.e. Anxiety, depression) - go directly to [facility].  Return to ER if worse, new symptoms, medical emergency, other concern.   Stimulant Use Disorder-Cocaine Cocaine is one of a group of powerful drugs called stimulants. Cocaine has medical uses for stopping nosebleeds and for pain control before minor nose or dental surgery. However, cocaine is misused because of the effects that it produces. These effects include:   A feeling of extreme pleasure.  Alertness.  High energy. Common street names for cocaine include coke, crack, blow, snow, and nose candy. Cocaine is snorted, dissolved in water and injected, or smoked.  Stimulants are addictive because they activate regions of the brain that produce both the pleasurable sensation of "reward" and psychological dependence. Together, these actions account for loss of control and the rapid development of drug dependence. This means you become ill without the drug (withdrawal) and need to keep using it to function.  Stimulant use disorder is use of stimulants that disrupts your daily life. It disrupts relationships with family and friends and how you do your job. Cocaine increases your blood pressure and heart rate. It can cause a heart attack or stroke. Cocaine can also cause death from irregular heart rate or seizures. SYMPTOMS Symptoms of stimulant use disorder with cocaine include:  Use of cocaine in larger amounts or over a longer period of time than intended.  Unsuccessful attempts to cut down or control cocaine use.  A lot of time spent obtaining, using, or recovering from the effects of cocaine.  A strong desire or urge to use cocaine  (craving).  Continued use of cocaine in spite of major problems at work, school, or home because of use.  Continued use of cocaine in spite of relationship problems because of use.  Giving up or cutting down on important life activities because of cocaine use.  Use of cocaine over and over in situations when it is physically hazardous, such as driving a car.  Continued use of cocaine in spite of a physical problem that is likely related to use. Physical problems can include:  Malnutrition.  Nosebleeds.  Chest pain.  High blood pressure.  A hole that develops between the part of your nose that separates your nostrils (perforated nasal septum).  Lung and kidney damage.  Continued use of cocaine in spite of a mental problem that is likely related to use. Mental problems can include:  Schizophrenia-like symptoms.  Depression.  Bipolar mood swings.  Anxiety.  Sleep problems.  Need to use more and more cocaine to get the same effect, or lessened effect over time with use of the same amount of cocaine (tolerance).  Having withdrawal symptoms when cocaine use is stopped, or using cocaine to reduce or avoid withdrawal symptoms. Withdrawal symptoms include:  Depressed or irritable mood.  Low energy or restlessness.  Bad dreams.  Poor or excessive sleep.  Increased appetite. DIAGNOSIS Stimulant use disorder is diagnosed by your health care provider. You may be asked questions about your cocaine use and how it affects your life. A physical exam may be done. A drug screen may be ordered. You may be referred to a mental health professional. The diagnosis of stimulant use disorder  requires at least two symptoms within 12 months. The type of stimulant use disorder depends on the number of signs and symptoms you have. The type may be:  Mild. Two or three signs and symptoms.  Moderate. Four or five signs and symptoms.  Severe. Six or more signs and  symptoms. TREATMENT Treatment for stimulant use disorder is usually provided by mental health professionals with training in substance use disorders. The following options are available:  Counseling or talk therapy. Talk therapy addresses the reasons you use cocaine and ways to keep you from using again. Goals of talk therapy include:  Identifying and avoiding triggers for use.  Handling cravings.  Replacing use with healthy activities.  Support groups. Support groups provide emotional support, advice, and guidance.  Medicine. Certain medicines may decrease cocaine cravings or withdrawal symptoms. HOME CARE INSTRUCTIONS  Take medicines only as directed by your health care provider.  Identify the people and activities that trigger your cocaine use and avoid them.  Keep all follow-up visits as directed by your health care provider. SEEK MEDICAL CARE IF:  Your symptoms get worse or you relapse.  You are not able to take medicines as directed. SEEK IMMEDIATE MEDICAL CARE IF:  You have serious thoughts about hurting yourself or others.  You have a seizure, chest pain, sudden weakness, or loss of speech or vision. FOR MORE INFORMATION  National Institute on Drug Abuse: http://www.price-smith.com/  Substance Abuse and Mental Health Services Administration: SkateOasis.com.pt Document Released: 10/11/2000 Document Revised: 02/28/2014 Document Reviewed: 10/27/2013 [facility] Patient Information 2015 [place], [place]. This information is not intended to replace advice given to you by your health care provider. Make sure you discuss any questions you have with your health care provider.    Emergency Department Resource Guide 1) Find a Doctor and Pay Out of Pocket Although you won't have to find out who is covered by your insurance plan, it is a good idea to ask around and get recommendations. You will then need to call the office and see if the doctor you have chosen will accept you as a new patient  and what types of options they offer for patients who are self-pay. Some doctors offer discounts or will set up payment plans for their patients who do not have insurance, but you will need to ask so you aren't surprised when you get to your appointment.  2) Contact Your Local Health Department Not all health departments have doctors that can see patients for sick visits, but many do, so it is worth a call to see if yours does. If you don't know where your local health department is, you can check in your phone book. The CDC also has a tool to help you locate your state's health department, and many state websites also have listings of all of their local health departments.  3) Find a Walk-in Clinic If your illness is not likely to be very severe or complicated, you may want to try a walk in clinic. These are popping up all over the country in pharmacies, drugstores, and shopping centers. They're usually staffed by nurse practitioners or physician assistants that have been trained to treat common illnesses and complaints. They're usually fairly quick and inexpensive. However, if you have serious medical issues or chronic medical problems, these are probably not your best option.  No Primary Care Doctor: - Call Health Connect at  [phone] - they can help you locate a primary care doctor that  accepts your insurance, provides certain services,  etc. - Physician Referral Service- [phone]  Chronic Pain Problems: Organization         Address  Phone   Notes  [facility]  [phone] Patients need to be referred by their primary care doctor.   Medication Assistance: Organization         Address  Phone   Notes  [facility] Medication [facility] [address] [place] [place], [place] [address] [phone] --Must be a resident of [facility] -- Must have NO insurance coverage whatsoever (no Medicaid/ Medicare, etc.) -- The pt. MUST have a primary care  doctor that directs their care regularly and follows them in the community   [facility]  [phone]   [employer]  [phone]    Agencies that provide inexpensive medical care: Organization         Address  Phone   Notes  [facility]  [phone]   [facility]    [phone]   [facility] [address] [place], [place] [address] [phone]   [facility] [address] [place] [place] [phone]   Planned Parenthood    [phone]   [facility]    [phone]   [facility]  [address], [place] Phone:  [phone], Fax:  [phone] Hours of Operation:  9 am - 6 pm, M-F.  Also accepts Medicaid/Medicare and self-pay.  [facility]  [address], [place] Phone: [phone], Fax: [phone]. Hours of Operation:  8:30 am - 5:30 pm, M-F.  Also accepts Medicaid and self-pay.  [facility] [place] [address], [place] Phone: [phone]   [facility] [address] Natasha Bence [place], [place] [phone], Ext. [phone] Mondays & Thursdays: 7-9 AM.  First 15 patients are seen on a first come, first serve basis.    Medicaid-accepting [facility] Providers:  Organization         Address  Phone   Notes  [facility] [address], [place] [phone] Also accepts self-pay patients.  [facility] [address] Laurell Josephs [place], [place]  [phone]   [facility] [address], [place] [phone]   [facility] [address], [place] [phone]   Renaye Rakers [address], [place]   [phone] Only accepts [place] Access [place] patients after they have their name applied to their card.   Self-Pay (no insurance) in [facility]:  Organization         Address  Phone   Notes  Sickle Cell Patients, [facility] [address] [place], [place] [phone]   [facility] [address] [place], [place] [phone]   [facility] [place]  [address], [place] [phone]   [facility]/Dr. Osei-Bonsu  [address], [place] or [address], [place] [phone] Phone number for both [place] and [place] locations is the same.  [facility] [address] [place] [phone]   [facility] [address], [place] or [address] [phone]   [facility] [address] [employer], [place] (  [phone], phone; [phone], fax Sees patients 1st and 3rd Saturday of every month.  Must not qualify for public or private insurance (i.e. Medicaid, Medicare, [place] Health Choice, Veterans' Benefits)  Household income should be no more than 200% of the poverty level The clinic cannot treat you if you are pregnant or think you are pregnant  Sexually transmitted diseases are not treated at the clinic.    Dental Care: Organization         Address  Phone  Notes  [facility] of [facility] [address] [place], [place] [phone] Accepts children up to age 69 who are enrolled in [place] or [place] Health Choice; pregnant women with a Medicaid card; and children who have applied for Medicaid or [place] Health Choice, but were declined, whose parents can pay a reduced fee at time of service.  [facility]  [address], [place] [phone] Accepts children up to age 65 who are enrolled in [place] or [place] Health Choice; pregnant women with a Medicaid card; and children who have applied for Medicaid or [place] Health Choice, but were declined, whose parents can  pay a reduced fee at time of service.  [facility]  [address] [place], [place] [phone] Patients are seen by appointment only. Walk-ins are not accepted. [facility] will see patients 6 years of age and older. Monday - Tuesday (8am-5pm) Most Wednesdays (8:30-5pm) $30 per visit, cash only  [facility] Adult Dental Access PRO[facility]  [address], [facility] [phone] Patients are seen by appointment only. Walk-ins are not accepted. [facility] will see patients 40 years of age and older. One Wednesday Evening (Monthly: Volunteer Based).  $30 per visit, cash only  [employer]  [phone] for adults; Children under age 72, call Graduate Pediatric Dentistry at [phone]. Children aged 28-14, please call [phone] to request a pediatric application.  Dental services are provided in all areas of dental care including fillings, crowns and bridges, complete and partial dentures, implants, gum treatment, root canals, and extractions. Preventive care is also provided. Treatment is provided to both adults and children. Patients are selected via a lottery and there is often a waiting list.   [facility] [address] [place]  [phone] [email or web address]   [facility] [address] [place], [place] [phone], Ext. [phone] Second and Fourth Thursday of each month, opens at 6:30 AM; Clinic ends at 9 AM.  Patients are seen on a first-come first-served basis, and a limited number are seen during each clinic.   [facility]  [address] Ether Griffins [place], [place] [phone]   Eligibility Requirements You must have lived in [place], [place], or [place] counties for at least the last three months.   You cannot be eligible for state or federal sponsored [place], including [employer], [place], or [employer].   You generally cannot be eligible for healthcare  insurance through your employer.    How to apply: Eligibility screenings are held every Tuesday and Wednesday afternoon from 1:00 pm until 4:00 pm. You do not need an appointment for the interview!  [facility] [address], [place], [place] [phone]   [facility] Health Department  [phone]   [facility] Health Department  [phone]   [facility] Health Department  [phone]    Behavioral Health  Resources in the Community: Intensive Outpatient Programs Organization         Address  Phone  Notes  [facility] [address] [place], [place] [phone]   [facility] Outpatient [address] [place], [place] [phone]   [facility] [address], [place], [place]  [phone]   [facility] [address],  [place], [place] [phone] or [phone]   Substance Abuse Resources Organization         Address  Phone  Notes  [facility]  [phone]   [facility]  [phone]   [facility] [place]  [phone]   Floydene Flock  [phone]   Residential & Outpatient Substance Abuse Program  [phone]   Psychological Services Organization         Address  Phone  Notes  [facility]  [phone]   [facility]  [phone]   [facility] [address] [place] [phone] or [phone]    Mobile Crisis Teams Organization         Address  Phone  Notes  [facility], Mobile Crisis Care Unit  [phone]   [facility]  [address] [place], [place] [phone]   Doristine Locks [address] [place] [phone]    Self-Help/Support Groups Organization         Address  Phone             Notes  [facility] [place] - variety of support groups  336- I7437963 Call for more information  Narcotics Anonymous (NA),  [facility] [address], [employer] [place]  2 meetings at this location   Statistician         Address  Phone  Notes  [facility] [address],    [place]  [phone]   [facility]  [address] [place] [address], [place], [place] [phone]   [facility] [address] [place], [place] [phone] Admissions: 8am-3pm M-F  [facility] [address],    [place], [place] [phone]   [facility] [address] [place], [place], [place] [phone]   [facility] [address]  [place], [place] [phone]   [facility] - Intensive Outpatient [address] Laurell Josephs [address], [place], [place] [phone]   [facility] ([facility] [address] [place]  [place], [place] [phone] or [phone]   [facility] ([facility]) [address], [place], [place] [phone] Accepts Medicaid  [facility] [place] [facility] [address]  [place] [phone] Substance Abuse/Addiction Treatment   [facility] Organization         Address  Phone  Notes  [facility]  [phone]   Angie Fava, PhD [address] Ervin Knack [place], [place]   [phone] or [phone]   [facility]al   [address] [place], [place] [phone]   [facility] [address], [place], [place] [phone] Insurance/Medicaid/sponsorship through [employer] and Families [address]                                    [place], [place] [phone] Therapy/tele-psych/case  [facility] [address]   [place], [place] [phone])  [phone]    Dr. Lolly Mustache  [phone]   [facility]  [employer] [facility] Dept. 1) [address], [place] 2) [address] [place] 3)  [address], [place] [phone]  [phone]    [facility] Child Abuse Hotline [phone] or [phone] (After Hours)

## 2015-06-24 NOTE — ED Notes (Signed)
Pt presents wanting cocaine detox and c/o headache since last night.  Pain score 7/10.  Pt reports that he has abused cocaine x "years" and been on a binge x 2 years.  Sts last use was this morning.   Denies SI/HI/AV.

## 2016-07-19 ENCOUNTER — Emergency Department (HOSPITAL_COMMUNITY)
Admission: EM | Admit: 2016-07-19 | Discharge: 2016-07-19 | Disposition: A | Discharge status: Home / Self Care | Payer: Self-pay | Attending: Dermatology | Admitting: Dermatology

## 2016-07-19 DIAGNOSIS — Z5321 Procedure and treatment not carried out due to patient leaving prior to being seen by health care provider: Secondary | ICD-10-CM | POA: Insufficient documentation

## 2016-07-19 DIAGNOSIS — F17210 Nicotine dependence, cigarettes, uncomplicated: Secondary | ICD-10-CM | POA: Insufficient documentation

## 2016-07-19 DIAGNOSIS — Z041 Encounter for examination and observation following transport accident: Secondary | ICD-10-CM | POA: Insufficient documentation

## 2016-07-19 NOTE — ED Notes (Addendum)
Pt was called to triage with no reply

## 2016-07-19 NOTE — ED Notes (Signed)
Pt called x 2 for triage with no reply

## 2016-07-26 ENCOUNTER — Emergency Department (HOSPITAL_COMMUNITY)
Admission: EM | Admit: 2016-07-26 | Discharge: 2016-07-26 | Disposition: A | Discharge status: Home / Self Care | Payer: No Typology Code available for payment source | Attending: Emergency Medicine | Admitting: Emergency Medicine

## 2016-07-26 ENCOUNTER — Emergency Department (HOSPITAL_COMMUNITY): Payer: No Typology Code available for payment source

## 2016-07-26 ENCOUNTER — Encounter (HOSPITAL_COMMUNITY): Payer: Self-pay | Admitting: Emergency Medicine

## 2016-07-26 DIAGNOSIS — Y999 Unspecified external cause status: Secondary | ICD-10-CM | POA: Insufficient documentation

## 2016-07-26 DIAGNOSIS — S0990XA Unspecified injury of head, initial encounter: Secondary | ICD-10-CM | POA: Diagnosis not present

## 2016-07-26 DIAGNOSIS — F17210 Nicotine dependence, cigarettes, uncomplicated: Secondary | ICD-10-CM | POA: Insufficient documentation

## 2016-07-26 DIAGNOSIS — Y92410 Unspecified street and highway as the place of occurrence of the external cause: Secondary | ICD-10-CM | POA: Diagnosis not present

## 2016-07-26 DIAGNOSIS — Y939 Activity, unspecified: Secondary | ICD-10-CM | POA: Diagnosis not present
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
## 2018-06-28 ENCOUNTER — Other Ambulatory Visit: Payer: Self-pay

## 2018-06-28 ENCOUNTER — Emergency Department
Admission: EM | Admit: 2018-06-28 | Discharge: 2018-06-28 | Disposition: A | Discharge status: Home / Self Care | Payer: Self-pay | Attending: Emergency Medicine | Admitting: Emergency Medicine

## 2018-06-28 DIAGNOSIS — F17210 Nicotine dependence, cigarettes, uncomplicated: Secondary | ICD-10-CM | POA: Insufficient documentation

## 2018-06-28 DIAGNOSIS — L0291 Cutaneous abscess, unspecified: Secondary | ICD-10-CM

## 2018-06-28 DIAGNOSIS — L0201 Cutaneous abscess of face: Secondary | ICD-10-CM | POA: Insufficient documentation

## 2018-06-28 MED ORDER — LIDOCAINE-EPINEPHRINE-TETRACAINE (LET) SOLUTION
3.0000 mL | Freq: Once | NASAL | Status: AC
  Administered 2018-06-28: 3 mL via TOPICAL
  Filled 2018-06-28: qty 3

## 2018-06-28 NOTE — ED Triage Notes (Signed)
Reports abscess behind right ear for the past 3 weeks.  Reports getting bigger and more painful.

## 2018-06-28 NOTE — Discharge Instructions (Signed)
Apply antibiotic ointment to the area 2 times per day. Return to the ER or see primary care if the area begins to get red, swollen, or more painful.

## 2018-06-28 NOTE — ED Provider Notes (Signed)
[facility] Emergency Department Provider Note  ____________________________________________  Time seen: Approximately 7:52 PM  I have reviewed the triage vital signs and the nursing notes.   HISTORY  Chief Complaint Abscess   HPI Sean Khan is a 37 y.o. male who presents to the emergency department for treatment of abscess behind the right ear that has been present for the past 3 weeks. Area has grown in size and is now very tender. No previous abscess or skin infection in the past. No alleviating measures attempted.   Past Medical History:  Diagnosis Date  . Pericarditis   . Substance abuse    Tobacco, cocaine    Patient Active Problem List   Diagnosis Date Noted  . Counseling on substance use and abuse 03/02/2013  . Onychomycosis with ingrown toenail 03/02/2013  . Acute pericarditis, unspecified 03/01/2013    Past Surgical History:  Procedure Laterality Date  . FETAL SURGERY FOR CONGENITAL HERNIA      Prior to Admission medications   Medication Sig Start Date End Date Taking? Authorizing Provider  acetaminophen (TYLENOL) 500 MG tablet Take 1,000 mg by mouth every 6 (six) hours as needed for mild pain.    [provider]    Allergies Patient has no known allergies.  No family history on file.  Social History Social History   Tobacco Use  . Smoking status: Current Every Day Smoker    Packs/day: 1.00    Types: Cigarettes  . Smokeless tobacco: Never Used  Substance Use Topics  . Alcohol use: Yes  . Drug use: No    Types: Cocaine    Comment: quit a year and 1/2 ago.     Review of Systems  Constitutional: Negative for fever. Respiratory: Negative for cough or shortness of breath.  Musculoskeletal: Negative for myalgias Skin: Positive for abscess behind right ear. Neurological: Negative for numbness or paresthesias. ____________________________________________   PHYSICAL EXAM:  VITAL SIGNS: ED Triage Vitals  [06/28/18 1905]  Enc Vitals Group     BP (!) 142/77     Pulse Rate 74     Resp 16     Temp 98.7 F (37.1 C)     Temp Source Oral     SpO2 98 %     Weight 165 lb (74.8 kg)     Height 5\' 9"  (1.753 m)     Head Circumference      Peak Flow      Pain Score 5     Pain Loc      Pain Edu?      Excl. in GC?      Constitutional: Well appearing. Eyes: Conjunctivae are clear without discharge or drainage. Nose: No rhinorrhea noted. Mouth/Throat: Airway is patent.  Neck: No stridor. Unrestricted range of motion observed. Cardiovascular: Capillary refill is <3 seconds.  Respiratory: Respirations are even and unlabored.. Musculoskeletal: Unrestricted range of motion observed. Neurologic: Awake, alert, and oriented x 4.  Skin:  0.5cm fluctuant area in the postauricular area on the right side.  ____________________________________________   LABS (all labs ordered are listed, but only abnormal results are displayed)  Labs Reviewed - No data to display ____________________________________________  EKG  Not indicated. ____________________________________________  RADIOLOGY  Not indicated ____________________________________________   PROCEDURES  .Marland KitchenIncision and Drainage Date/Time: 06/28/2018 7:55 PM Performed by: Chinita Pester, FNP Authorized by: Chinita Pester, FNP   Consent:    Consent obtained:  Verbal   Consent given by:  Patient   Risks discussed:  Bleeding, infection, incomplete drainage and pain   Alternatives discussed:  Observation Location:    Type:  [place]   Location: Right postauricular. Pre-procedure details:    Skin preparation:  Betadine Anesthesia (see MAR for exact dosages):    Anesthesia method:  Topical application Procedure type:    Complexity:  Simple Procedure details:    Needle aspiration: yes     Needle size:  18 G   Incision depth:  Dermal   Drainage:  Purulent   Drainage amount:  Moderate   Wound treatment:  Wound left open    Packing materials:  None Post-procedure details:    Patient tolerance of procedure:  Tolerated well, no immediate complications   ____________________________________________   INITIAL IMPRESSION / ASSESSMENT AND PLAN / ED COURSE  Sean Khan is a 37 y.o. male who presents to the emergency department for treatment and evaluation of a tender fluctuant area behind the right ear.  Needle aspiration was completed with result of purulent material.  He will be encouraged to use antibiotic ointment on the area 2 times per day until completely healed.  He was encouraged to return to the emergency department for symptoms of change or worsen if he is unable to schedule appointment with primary care.   Medications  lidocaine-EPINEPHrine-tetracaine (LET) solution (3 mLs Topical Given 06/28/18 1922)     Pertinent labs & imaging results that were available during my care of the patient were reviewed by me and considered in my medical decision making (see chart for details).  ____________________________________________   FINAL CLINICAL IMPRESSION(S) / ED DIAGNOSES  Final diagnoses:  Abscess    ED Discharge Orders    None       Note:  This document was prepared using Dragon voice recognition software and may include unintentional dictation errors.    Chinita Pester, FNP 06/28/18 Brooke Pace    Sharman Cheek, MD 06/28/18 2358

## 2020-02-21 ENCOUNTER — Encounter (HOSPITAL_COMMUNITY): Payer: Self-pay | Admitting: Emergency Medicine

## 2020-02-21 ENCOUNTER — Other Ambulatory Visit: Payer: Self-pay

## 2020-02-21 ENCOUNTER — Emergency Department (HOSPITAL_COMMUNITY)
Admission: EM | Admit: 2020-02-21 | Discharge: 2020-02-21 | Disposition: A | Discharge status: Home / Self Care | Payer: Self-pay | Attending: Emergency Medicine | Admitting: Emergency Medicine

## 2020-02-21 DIAGNOSIS — H53149 Visual discomfort, unspecified: Secondary | ICD-10-CM | POA: Insufficient documentation

## 2020-02-21 DIAGNOSIS — H1132 Conjunctival hemorrhage, left eye: Secondary | ICD-10-CM | POA: Insufficient documentation

## 2020-02-21 DIAGNOSIS — F17210 Nicotine dependence, cigarettes, uncomplicated: Secondary | ICD-10-CM | POA: Insufficient documentation

## 2020-02-21 MED ORDER — TETRACAINE HCL 0.5 % OP SOLN
2.0000 [drp] | Freq: Once | OPHTHALMIC | Status: AC
  Administered 2020-02-21: 2 [drp] via OPHTHALMIC
  Filled 2020-02-21: qty 4

## 2020-02-21 MED ORDER — FLUORESCEIN SODIUM 1 MG OP STRP
1.0000 | ORAL_STRIP | Freq: Once | OPHTHALMIC | Status: AC
  Administered 2020-02-21: 1 via OPHTHALMIC
  Filled 2020-02-21: qty 1

## 2020-02-21 NOTE — ED Provider Notes (Signed)
[facility]EMER[facility]Y DEPT Provider Note   CSN: [id number] Arrival date & time: 02/21/20  1421     History Chief Complaint  Patient presents with  . Eye Pain    Sean Khan is a 39 y.o. male.  39yo male presents with complaint of left eye pain. States 2 days ago he was carrying heavy furniture when the furniture hit him in the left eye resulting in left eye pain, feels like his eye was pressed up against his nose. Wears glasses, feels like vision is more blurry in left eye compared to right. Also left 3rd finger stiffness, difficulty making a full fist, unsure what happened to his hand. No other injuries or concerns.         Past Medical History:  Diagnosis Date  . Pericarditis   . Substance abuse ([facility])    Tobacco, cocaine    Patient Active Problem List   Diagnosis Date Noted  . Counseling on substance use and abuse 03/02/2013  . Onychomycosis with ingrown toenail 03/02/2013  . Acute pericarditis, unspecified 03/01/2013    Past Surgical History:  Procedure Laterality Date  . FETAL SURGERY FOR CONGENITAL HERNIA         No family history on file.  Social History   Tobacco Use  . Smoking status: Current Every Day Smoker    Packs/day: 1.00    Types: Cigarettes  . Smokeless tobacco: Never Used  Substance Use Topics  . Alcohol use: Yes  . Drug use: No    Types: Cocaine    Comment: quit a year and 1/2 ago.     Home Medications Prior to Admission medications   Medication Sig Start Date End Date Taking? Authorizing Provider  acetaminophen (TYLENOL) 500 MG tablet Take 1,000 mg by mouth every 6 (six) hours as needed for mild pain.    [provider]    Allergies    Patient has no known allergies.  Review of Systems   Review of Systems  Constitutional: Negative for fever.  Eyes: Positive for photophobia, pain, redness and visual disturbance.  Musculoskeletal: Positive for arthralgias.  Skin: Negative for color change, rash and  wound.  Neurological: Negative for headaches.  Hematological: Does not bruise/bleed easily.  Psychiatric/Behavioral: Negative for confusion.    Physical Exam Updated Vital Signs BP (!) 151/93 (BP Location: Left Arm)   Pulse 87   Temp 98.2 F (36.8 C) (Oral)   Resp 18   SpO2 100%   Physical Exam Vitals and nursing note reviewed.  Constitutional:      General: He is not in acute distress.    Appearance: He is well-developed. He is not diaphoretic.  HENT:     Head: Normocephalic and atraumatic.  Eyes:     Extraocular Movements: Extraocular movements intact.     Conjunctiva/sclera:     Left eye: Hemorrhage present.     Pupils: Pupils are equal, round, and reactive to light.     Right eye: Pupil is round, reactive and not sluggish. No corneal abrasion or fluorescein uptake. Seidel exam negative.     Funduscopic exam:    Right eye: Red reflex present.     Slit lamp exam:    Right eye: Anterior chamber quiet.   Pulmonary:     Effort: Pulmonary effort is normal.  Musculoskeletal:        General: Swelling present. No tenderness.       Arms:  Skin:    General: Skin is warm and dry.  Findings: No erythema or rash.  Neurological:     Mental Status: He is alert and oriented to person, place, and time.  Psychiatric:        Behavior: Behavior normal.     ED Results / Procedures / Treatments   Labs (all labs ordered are listed, but only abnormal results are displayed) Labs Reviewed - No data to display  EKG None  Radiology No results found.  Procedures Procedures (including critical care time)  Medications Ordered in ED Medications  fluorescein ophthalmic strip 1 strip (1 strip Left Eye Given by Other 02/21/20 1921)  tetracaine (PONTOCAINE) 0.5 % ophthalmic solution 2 drop (2 drops Left Eye Given by Other 02/21/20 1921)    ED Course  I have reviewed the triage vital signs and the nursing notes.  Pertinent labs & imaging results that were available during my  care of the patient were reviewed by me and considered in my medical decision making (see chart for details).  Clinical Course as of Feb 20 1953  Mon Feb 21, 3427  [id number] 39 year old male with complaint of left eye pain and redness after the corner edge of a wardrobe hit him in the left eye 2 days ago.  On exam has some conjunctival hemorrhage.  Fluorescein stain with Woods lamp unremarkable.  Recommend cool compress to area, Motrin Tylenol as needed follow-up with ophthalmology if pain persist.   [LM]    Clinical Course User Index [LM] Roque Lias   MDM Rules/Calculators/A&P                      Final Clinical Impression(s) / ED Diagnoses Final diagnoses:  Subconjunctival hemorrhage of left eye    Rx / DC Orders ED Discharge Orders    None       Tacy Learn, PA-C 02/21/20 1954    Daleen Bo, MD 02/22/20 [phone]

## 2020-02-21 NOTE — Discharge Instructions (Signed)
Follow up with ophthalmology if pain continues.

## 2020-02-21 NOTE — ED Triage Notes (Signed)
Per pt, states he was moving furniture this weekend-corner of wardrobe struck left eye-eye irritated and red, no drainage

## 2020-03-22 ENCOUNTER — Ambulatory Visit (HOSPITAL_COMMUNITY)
Admission: EM | Admit: 2020-03-22 | Discharge: 2020-03-22 | Disposition: A | Discharge status: Other Acute Inpatient Hospital | Payer: Self-pay | Attending: Emergency Medicine | Admitting: Emergency Medicine

## 2020-03-22 ENCOUNTER — Encounter (HOSPITAL_COMMUNITY): Payer: Self-pay

## 2020-03-22 ENCOUNTER — Other Ambulatory Visit: Payer: Self-pay

## 2020-03-22 ENCOUNTER — Encounter (HOSPITAL_COMMUNITY): Payer: Self-pay | Admitting: *Deleted

## 2020-03-22 ENCOUNTER — Emergency Department (HOSPITAL_COMMUNITY)
Admission: EM | Admit: 2020-03-22 | Discharge: 2020-03-23 | Disposition: A | Discharge status: Home / Self Care | Payer: PRIVATE HEALTH INSURANCE | Attending: Emergency Medicine | Admitting: Emergency Medicine

## 2020-03-22 ENCOUNTER — Emergency Department (HOSPITAL_COMMUNITY): Payer: PRIVATE HEALTH INSURANCE

## 2020-03-22 DIAGNOSIS — R0789 Other chest pain: Secondary | ICD-10-CM | POA: Diagnosis present

## 2020-03-22 DIAGNOSIS — R0602 Shortness of breath: Secondary | ICD-10-CM | POA: Diagnosis not present

## 2020-03-22 DIAGNOSIS — R079 Chest pain, unspecified: Secondary | ICD-10-CM

## 2020-03-22 DIAGNOSIS — R9431 Abnormal electrocardiogram [ECG] [EKG]: Secondary | ICD-10-CM

## 2020-03-22 DIAGNOSIS — Z5321 Procedure and treatment not carried out due to patient leaving prior to being seen by health care provider: Secondary | ICD-10-CM | POA: Diagnosis not present

## 2020-03-22 LAB — CBC
HCT: 44.4 % (ref 39.0–52.0)
Hemoglobin: 14.8 g/dL (ref 13.0–17.0)
MCH: 27 pg (ref 26.0–34.0)
MCHC: 33.3 g/dL (ref 30.0–36.0)
MCV: 81 fL (ref 80.0–100.0)
Platelets: 259 10*3/uL (ref 150–400)
RBC: 5.48 MIL/uL (ref 4.22–5.81)
RDW: 14.9 % (ref 11.5–15.5)
WBC: 6.5 10*3/uL (ref 4.0–10.5)
nRBC: 0 % (ref 0.0–0.2)

## 2020-03-22 LAB — BASIC METABOLIC PANEL WITH GFR
Anion gap: 7 (ref 5–15)
BUN: 15 mg/dL (ref 6–20)
CO2: 27 mmol/L (ref 22–32)
Calcium: 9.2 mg/dL (ref 8.9–10.3)
Chloride: 104 mmol/L (ref 98–111)
Creatinine, Ser: 1.35 mg/dL — ABNORMAL HIGH (ref 0.61–1.24)
GFR calc Af Amer: >60 mL/min
GFR calc non Af Amer: >60 mL/min
Glucose, Bld: 109 mg/dL — ABNORMAL HIGH (ref 70–99)
Potassium: 4.3 mmol/L (ref 3.5–5.1)
Sodium: 138 mmol/L (ref 135–145)

## 2020-03-22 LAB — TROPONIN I (HIGH SENSITIVITY)
Troponin I (High Sensitivity): 4 ng/L
Troponin I (High Sensitivity): 4 ng/L

## 2020-03-22 MED ORDER — ASPIRIN 81 MG PO CHEW
CHEWABLE_TABLET | ORAL | Status: AC
  Filled 2020-03-22: qty 3

## 2020-03-22 MED ORDER — ASPIRIN 81 MG PO CHEW
CHEWABLE_TABLET | ORAL | Status: AC
  Filled 2020-03-22: qty 1

## 2020-03-22 MED ORDER — SODIUM CHLORIDE 0.9% FLUSH: 3.0000 mL | Freq: Once | INTRAVENOUS | Status: DC

## 2020-03-22 MED ORDER — ASPIRIN 81 MG PO CHEW
324.0000 mg | CHEWABLE_TABLET | Freq: Once | ORAL | Status: AC
  Administered 2020-03-22: 324 mg via ORAL

## 2020-03-22 NOTE — ED Provider Notes (Signed)
HPI  SUBJECTIVE:  Sean Khan is a 39 y.o. male who presents with 2 to 3 weeks of left-sided chest pain that he describes as pressure accompanied with shortness of breath.  States that the chest pain occasionally radiates down his arm.  Reports nausea and diaphoresis starting today.  He reports constant subscapular back pain during this time.  No calf pain, swelling, hemoptysis, recent immobilization, history of DVT, PE.  No abdominal pain, syncope.  Symptoms are worse with exertion.  No alleviating factors.  He has a past medical history of cocaine abuse, has not used any in the past 6 or 7 months.  He also has a history of pericarditis.  No history of hypertension, diabetes, hypercholesterolemia, PE, DVT.  Family history negative for early MI.   Past Medical History:  Diagnosis Date  . Pericarditis   . Pericarditis   . Substance abuse ([place])    Tobacco, cocaine    Past Surgical History:  Procedure Laterality Date  . FETAL SURGERY FOR CONGENITAL HERNIA      History reviewed. No pertinent family history.  Social History   Tobacco Use  . Smoking status: Current Every Day Smoker    Packs/day: 1.00    Types: Cigarettes  . Smokeless tobacco: Never Used  Substance Use Topics  . Alcohol use: Yes    Alcohol/week: 6.0 standard drinks    Types: 6 Cans of beer per week  . Drug use: No    Types: Cocaine    Comment: quit a year and 1/2 ago.     No current facility-administered medications for this encounter.  Current Outpatient Medications:  .  acetaminophen (TYLENOL) 500 MG tablet, Take 1,000 mg by mouth every 6 (six) hours as needed for mild pain., Disp: , Rfl:   No Known Allergies   ROS  As noted in HPI.   Physical Exam  BP 129/78   Pulse (!) 104   Temp 98.5 F (36.9 C) (Oral)   Resp 18   Ht 5\' 9"  (1.753 m)   Wt 72.6 kg   SpO2 97%   BMI 23.63 kg/m   Constitutional: Well developed, well nourished, no acute distress Eyes:  EOMI, conjunctiva normal  bilaterally HENT: Normocephalic, atraumatic,mucus membranes moist Respiratory: Normal inspiratory effort, lungs clear bilaterally good air movement. Cardiovascular: Regular tachycardia no murmurs rubs or gallops.  RP 2+ and equal.  No chest wall tenderness. GI: nondistended skin: No rash, skin intact Musculoskeletal: Calves symmetric, nontender, no edema. Neurologic: Alert & oriented x 3, no focal neuro deficits Psychiatric: Speech and behavior appropriate   ED Course   Medications  aspirin chewable tablet 324 mg (324 mg Oral Given 03/22/20 1844)    Orders Placed This Encounter  Procedures  . ED EKG    Standing Status:   Standing    Number of Occurrences:   1    Order Specific Question:   Reason for Exam    Answer:   Chest Pain    No results found for this or any previous visit (from the past 24 hour(s)). No results found.  ED Clinical Impression  1. Chest pain, unspecified type   2. Abnormal EKG      ED Assessment/Plan  EKG: Sinus tachycardia, rate 103.  Normal axis, normal intervals.  LVH.  T wave inversion in the inferior leads II, 3, aVF and lateral leads V5 V6.  Previous EKG shows pericarditis.  EKG is concerning for ischemia.  No evidence of STEMI.  Also in the differential is  PE.  Giving 325 mg of aspirin p.o. here.  Immediately transferring to the ED via wheelchair.  Discussed medical decision-making, rationale for transfer to emergency department with the patient.  He agrees with plan.  Meds ordered this encounter  Medications  . aspirin chewable tablet 324 mg    *This clinic note was created using Scientist, clinical (histocompatibility and immunogenetics). Therefore, there may be occasional mistakes despite careful proofreading.   ?    Domenick Gong, MD 03/22/20 1851

## 2020-03-22 NOTE — ED Triage Notes (Signed)
Pt c/o 5/10 non radiating left sided constant chest painx2wks. Pt c/o SOB. Pt states SOB gets worse when he lays down. Pt states the pain gets worse and changes to sharp with different movements. Pt states he was dx with pericarditis in 2014. Pt denies N/V. Pt has non labored breathing. Kin is dry and color WNL.

## 2020-03-22 NOTE — Discharge Instructions (Addendum)
I am concerned that you are having a problem with your heart with your abnormal EKG. let them know if your chest pain changes, gets worse.

## 2020-03-22 NOTE — ED Notes (Signed)
Pt called for triage no answer x1. 

## 2020-03-22 NOTE — ED Notes (Addendum)
Pt sent to ED per Dr. Chaney Malling due to EKG changes. Pt taken to ED in [facility] by urgent care staff.

## 2020-03-22 NOTE — ED Triage Notes (Signed)
Pt reporting that he has had intermittent left sided chest pain-left side of chest, left axilla and left back area. SOB- worse when moving around and laying down. Hx of pericarditis.

## 2020-03-23 ENCOUNTER — Encounter (HOSPITAL_COMMUNITY): Payer: Self-pay | Admitting: Emergency Medicine

## 2020-03-23 ENCOUNTER — Emergency Department (HOSPITAL_COMMUNITY): Payer: PRIVATE HEALTH INSURANCE

## 2020-03-23 ENCOUNTER — Other Ambulatory Visit: Payer: Self-pay

## 2020-03-23 ENCOUNTER — Emergency Department (HOSPITAL_COMMUNITY)
Admission: EM | Admit: 2020-03-23 | Discharge: 2020-03-23 | Disposition: A | Discharge status: Home / Self Care | Payer: PRIVATE HEALTH INSURANCE | Source: Home / Self Care | Attending: Emergency Medicine | Admitting: Emergency Medicine

## 2020-03-23 DIAGNOSIS — R079 Chest pain, unspecified: Secondary | ICD-10-CM

## 2020-03-23 LAB — TROPONIN I (HIGH SENSITIVITY)
Troponin I (High Sensitivity): 2 ng/L (ref <18)
Troponin I (High Sensitivity): 2 ng/L (ref <18)

## 2020-03-23 LAB — CBC
HCT: 41 % (ref 39.0–52.0)
Hemoglobin: 13.4 g/dL (ref 13.0–17.0)
MCH: 27 pg (ref 26.0–34.0)
MCHC: 32.7 g/dL (ref 30.0–36.0)
MCV: 82.5 fL (ref 80.0–100.0)
Platelets: 213 10*3/uL (ref 150–400)
RBC: 4.97 MIL/uL (ref 4.22–5.81)
RDW: 15.4 % (ref 11.5–15.5)
WBC: 4.7 10*3/uL (ref 4.0–10.5)
nRBC: 0 % (ref 0.0–0.2)

## 2020-03-23 LAB — COMPREHENSIVE METABOLIC PANEL
ALT: 14 U/L (ref 0–44)
AST: 21 U/L (ref 15–41)
Albumin: 4 g/dL (ref 3.5–5.0)
Alkaline Phosphatase: 36 U/L — ABNORMAL LOW (ref 38–126)
Anion gap: 7 (ref 5–15)
BUN: 19 mg/dL (ref 6–20)
CO2: 27 mmol/L (ref 22–32)
Calcium: 8.7 mg/dL — ABNORMAL LOW (ref 8.9–10.3)
Chloride: 106 mmol/L (ref 98–111)
Creatinine, Ser: 1.14 mg/dL (ref 0.61–1.24)
GFR calc Af Amer: >60 mL/min (ref >60)
GFR calc non Af Amer: >60 mL/min (ref >60)
Glucose, Bld: 94 mg/dL (ref 70–99)
Potassium: 4.2 mmol/L (ref 3.5–5.1)
Sodium: 140 mmol/L (ref 135–145)
Total Bilirubin: 0.3 mg/dL (ref 0.3–1.2)
Total Protein: 6.5 g/dL (ref 6.5–8.1)

## 2020-03-23 LAB — SEDIMENTATION RATE: Sed Rate: 0 mm/hr (ref 0–16)

## 2020-03-23 MED ORDER — METHOCARBAMOL 500 MG PO TABS
500.0000 mg | ORAL_TABLET | Freq: Two times a day (BID) | ORAL | 0 refills | Status: DC
Start: 2020-03-23 — End: 2021-07-24

## 2020-03-23 MED ORDER — SODIUM CHLORIDE 0.9% FLUSH: 3.0000 mL | Freq: Once | INTRAVENOUS | Status: DC

## 2020-03-23 NOTE — ED Notes (Signed)
No answer x2 and not visible in lobby

## 2020-03-23 NOTE — ED Provider Notes (Signed)
[facility] DEPT Provider Note   CSN: [id number] Arrival date & time: 03/23/20  1646     History Chief Complaint  Patient presents with  . Chest Pain    Sean Khan is a 39 y.o. male.  HPI  Patient is a 39 year old male with past medical history significant for cocaine use and smoking.  He states that he has not used cocaine in several weeks.   Patient states he has left-sided chest pain that he feels is worse when he is lifting heavy objects with his left arm at work.  He states that it has been intermittent/episodic for approximately 2 to 3 weeks.  He states that he occasionally feels some pressure and shortness of breath as well but states that primarily it is a sharp pain in his armpit.  He states that he had one episode where the pain seemed to radiate down his arm he was lifting heavy object with his left hand.  He denies any nausea and diaphoresis although he specifically seems to endorse this and the note from the urgent care but he did yesterday.  He denies any radiation of the pain to his neck or left shoulder.  He denies any calf swelling, hemoptysis, recent travel or surgery.  He denies any history of DVT or PE.  He denies any weakness, syncope, near syncope or exertional symptoms.  No aggravating or alleviating factors although he does state that occasionally when he leans forward he feels like his pain is somewhat alleviated.  He states that he has been told he had pericarditis in the past although he never followed up or took the colchicine that he was prescribed.  No history of diabetes, high cholesterol, no family history of early MI.  HPI: A 39 year old patient presents for evaluation of chest pain. Initial onset of pain was more than 6 hours ago. The patient's chest pain is sharp and is not worse with exertion. The patient's chest pain is middle- or left-sided, is not well-localized, is not described as heaviness/pressure/tightness and does not  radiate to the arms/jaw/neck. The patient does not complain of nausea and denies diaphoresis. The patient has smoked in the past 90 days. The patient has no history of stroke, has no history of peripheral artery disease, denies any history of treated diabetes, has no relevant family history of coronary artery disease (first degree relative at less than age 18), is not hypertensive, has no history of hypercholesterolemia and does not have an elevated BMI (>=30).   Past Medical History:  Diagnosis Date  . Pericarditis   . Pericarditis   . Substance abuse ([place])    Tobacco, cocaine    Patient Active Problem List   Diagnosis Date Noted  . Counseling on substance use and abuse 03/02/2013  . Onychomycosis with ingrown toenail 03/02/2013  . Acute pericarditis, unspecified 03/01/2013    Past Surgical History:  Procedure Laterality Date  . FETAL SURGERY FOR CONGENITAL HERNIA         No family history on file.  Social History   Tobacco Use  . Smoking status: Current Every Day Smoker    Packs/day: 1.00    Types: Cigarettes  . Smokeless tobacco: Never Used  Substance Use Topics  . Alcohol use: Yes    Alcohol/week: 6.0 standard drinks    Types: 6 Cans of beer per week  . Drug use: No    Types: Cocaine    Comment: quit a year and 1/2 ago.  Home Medications Prior to Admission medications   Medication Sig Start Date End Date Taking? Authorizing Provider  methocarbamol (ROBAXIN) 500 MG tablet Take 1 tablet (500 mg total) by mouth 2 (two) times daily. 03/23/20   Tedd Sias, PA    Allergies    Patient has no known allergies.  Review of Systems   Review of Systems  Constitutional: Negative for chills and fever.  HENT: Negative for congestion.   Eyes: Negative for pain.  Respiratory: Positive for chest tightness and shortness of breath. Negative for cough.   Cardiovascular: Positive for chest pain. Negative for leg swelling.  Gastrointestinal: Negative for abdominal pain,  diarrhea, nausea and vomiting.  Genitourinary: Negative for dysuria.  Musculoskeletal: Negative for myalgias.  Skin: Negative for rash.  Neurological: Negative for dizziness and headaches.    Physical Exam Updated Vital Signs BP (!) 131/91   Pulse 63   Temp 98.5 F (36.9 C)   Resp 16   SpO2 99%   Physical Exam Vitals and nursing note reviewed.  Constitutional:      General: He is not in acute distress. HENT:     Head: Normocephalic and atraumatic.     Nose: Nose normal.  Eyes:     General: No scleral icterus. Cardiovascular:     Rate and Rhythm: Normal rate and regular rhythm.     Pulses: Normal pulses.     Heart sounds: Normal heart sounds.  Pulmonary:     Effort: Pulmonary effort is normal. No respiratory distress.     Breath sounds: No wheezing.     Comments: Reproducible tenderness to palpation of the left armpit and left pectoral muscle. Abdominal:     Palpations: Abdomen is soft.     Tenderness: There is no abdominal tenderness. There is no guarding or rebound.  Musculoskeletal:     Cervical back: Normal range of motion.     Right lower leg: No edema.     Left lower leg: No edema.  Skin:    General: Skin is warm and dry.     Capillary Refill: Capillary refill takes less than 2 seconds.  Neurological:     Mental Status: He is alert. Mental status is at baseline.  Psychiatric:        Mood and Affect: Mood normal.        Behavior: Behavior normal.     ED Results / Procedures / Treatments   Labs (all labs ordered are listed, but only abnormal results are displayed) Labs Reviewed  COMPREHENSIVE METABOLIC PANEL - Abnormal; Notable for the following components:      Result Value   Calcium 8.7 (*)    Alkaline Phosphatase 36 (*)    All other components within normal limits  CBC  SEDIMENTATION RATE  TROPONIN I (HIGH SENSITIVITY)  TROPONIN I (HIGH SENSITIVITY)    EKG EKG Interpretation  Date/Time:  Thursday Mar 23 2020 16:55:25 EDT Ventricular Rate:    75 PR Interval:    QRS Duration: 73 QT Interval:  348 QTC Calculation: 389 R Axis:   79 Text Interpretation: Sinus rhythm Left ventricular hypertrophy Anterior ST elevation, probably due to LVH Lateral leads are also involved 12 Lead; [place] Since prior ECG,< TW abnormality has improved Possible pericarditis vs LVH Confirmed by Gareth Morgan [phone]) on 03/23/2020 6:04:54 PM   Radiology DG Chest 2 View  Result Date: 03/23/2020 CLINICAL DATA:  Chest pain. EXAM: CHEST - 2 VIEW COMPARISON:  Mar 22, 2020. FINDINGS: The heart size and mediastinal contours  are within normal limits. Both lungs are clear. No pneumothorax or pleural effusion is noted. The visualized skeletal structures are unremarkable. IMPRESSION: No active cardiopulmonary disease. Electronically Signed   By: Marijo Conception M.D.   On: 03/23/2020 19:08    Procedures Procedures (including critical care time)  Medications Ordered in ED Medications - No data to display  ED Course  I have reviewed the triage vital signs and the nursing notes.  Pertinent labs & imaging results that were available during my care of the patient were reviewed by me and considered in my medical decision making (see chart for details).    MDM Rules/Calculators/A&P HEAR Score: 2                    Patient has very atypical chest pain has been ongoing for 2-3 weeks.  He has a history of pericarditis and was seen in urgent care with atypical EKG that triggered concern and was sent to emergency department for further evaluation.  He was seen in urgent care yesterday and sent to ED that day however he left without being seen after a single troponin which was within normal limits as well as CBC and CMP without abnormality.  Most likely explanations for symptoms today included pericarditis, the ER is explanation for abnormal upsloping ST elevation and costochondritis/pericarditis.  The emergent causes of chest pain include: Acute coronary syndrome,  tamponade, pericarditis/myocarditis, aortic dissection, pulmonary embolism, tension pneumothorax, pneumonia, and esophageal rupture.  EKG with likely BER although question of pericarditis.  Labs pending.  Chest x-ray without any acute abnormality.  No infiltrates or evidence of pneumonia.  CMP without electrolyte abnormality CBC without anemia or leukocytosis.  Troponin X1 within normal limits at 2.  Sed rate 0.  Very much doubt pericarditis with such a low sed rate.  Troponin next to pending at this time.  Vital signs within normal limits physical exam is very reassuring he has no JVD or hypotension or tachycardia to indicate tamponade.  I do not believe the patient has an emergent cause of chest pain, other urgent/non-acute considerations include, but are not limited to: chronic angina, aortic stenosis, cardiomyopathy, mitral valve prolapse, pulmonary hypertension, aortic insufficiency, right ventricular hypertrophy, pleuritis, bronchitis, pneumothorax, tumor, gastroesophageal reflux disease (GERD), esophageal spasm, Mallory-Weiss syndrome, peptic ulcer disease, pancreatitis, functional gastrointestinal pain, cervical or thoracic disk disease or arthritis, shoulder arthritis, costochondritis, subacromial bursitis, anxiety or panic attack, herpes zoster, breast disorders, chest wall tumors, thoracic outlet syndrome, mediastinitis.  I discussed this case with my attending physician who cosigned this note including patient's presenting symptoms, physical exam, and planned diagnostics and interventions. Attending physician stated agreement with plan or made changes to plan which were implemented.   Second troponin pending at time of shift change. Dr. Billy Fischer will disposition pt after second troponin. Anticipate discharge after second troponin is negative.  Patient is well-appearing is without chest pain at this time.  Given negative ESR doubt pericarditis.  Doubt ACS given his 2 - troponins and  myocarditis for the same reason. Doubt PE as patient is not tachycardic nor is he hypoxic.  His pain is not pleuritic and he has no palpitations.  He does seem to be somewhat positional which makes it more likely that this could be musculoskeletal.  Is also worsened by picking up objects with his left hand.  I prescribed patient Robaxin to use at home.  Patient given return precautions.  He is understanding of plan and comfortable discharge pending 2nd troponin.  He will follow-up with his PCP and a cardiologist.   Final Clinical Impression(s) / ED Diagnoses Final diagnoses:  Chest pain, unspecified type    Rx / DC Orders ED Discharge Orders         Ordered    methocarbamol (ROBAXIN) 500 MG tablet  2 times daily     03/23/20 2133           Tedd Sias, [place] 03/25/20 1109    Gareth Morgan, MD 03/27/20 1452

## 2020-03-23 NOTE — Discharge Instructions (Addendum)
Please follow-up with your primary care doctor.  If you do not have a primary care doctor recommend the  [facility] wellness clinic.  If you have insurance I recommend calling your insurance provider to ensure that you are seen by a doctor/PA within your network.  Your blood work is very reassuring today.  Very much doubt pericarditis and suspect that the abnormality on her EKG is due to a condition called benign early repolarization which is a normal variation of an EKG and occurs often with young people.  Type using cocaine.  This can very much induce and worsen chest pain.

## 2020-03-23 NOTE — ED Triage Notes (Signed)
Per patient, states he went to an UC and they told him he had changes in his EKG from a few years ago-patient went to [facility] and had a cardiac work up-left due to wait-prior cocaine user, history of pericarditis

## 2020-03-23 NOTE — ED Notes (Signed)
Pt a/o x4 and ambulatory at discharge. Pt instructed to follow up with primary care provider resources provider in Discharge instructions pt verbalized understanding.

## 2020-03-23 NOTE — ED Notes (Signed)
No answer for vitals recheck x1 

## 2021-02-17 ENCOUNTER — Other Ambulatory Visit: Payer: Self-pay

## 2021-02-17 ENCOUNTER — Emergency Department
Admission: EM | Admit: 2021-02-17 | Discharge: 2021-02-17 | Disposition: A | Discharge status: Home / Self Care | Payer: PRIVATE HEALTH INSURANCE | Attending: Physician Assistant | Admitting: Physician Assistant

## 2021-02-17 DIAGNOSIS — K047 Periapical abscess without sinus: Secondary | ICD-10-CM | POA: Insufficient documentation

## 2021-02-17 DIAGNOSIS — F17210 Nicotine dependence, cigarettes, uncomplicated: Secondary | ICD-10-CM | POA: Insufficient documentation

## 2021-02-17 MED ORDER — OXYCODONE-ACETAMINOPHEN 7.5-325 MG PO TABS: 1.0000 | ORAL_TABLET | Freq: Four times a day (QID) | ORAL | 0 refills | Status: AC | PRN

## 2021-02-17 MED ORDER — AMOXICILLIN 500 MG PO CAPS
500.0000 mg | ORAL_CAPSULE | Freq: Three times a day (TID) | ORAL | 0 refills | Status: DC
Start: 2021-02-17 — End: 2021-07-24

## 2021-02-17 MED ORDER — IBUPROFEN 800 MG PO TABS
800.0000 mg | ORAL_TABLET | Freq: Three times a day (TID) | ORAL | 0 refills | Status: DC | PRN
Start: 2021-02-17 — End: 2021-09-13

## 2021-02-17 NOTE — ED Provider Notes (Signed)
[facility] Emergency Department Provider Note   ____________________________________________   Event Date/Time   First MD Initiated Contact with Patient 02/17/21 1237     (approximate)  I have reviewed the triage vital signs and the nursing notes.   HISTORY  Chief Complaint Dental Pain    HPI Sean Khan is a 40 y.o. male patient complaint is dental pain for 3 weeks.  Patient states he started taking leftover penicillin from his daughters prescription 3 days ago with no improvement.  Patient did notice increased gingival edema this morning.  No fever associated complaint.  Rates pain as a 4/10.  Described pain as "achy".  Apply warm compress to area prior to arrival.         Past Medical History:  Diagnosis Date  . Pericarditis   . Pericarditis   . Substance abuse ([facility])    Tobacco, cocaine    Patient Active Problem List   Diagnosis Date Noted  . Counseling on substance use and abuse 03/02/2013  . Onychomycosis with ingrown toenail 03/02/2013  . Acute pericarditis, unspecified 03/01/2013    Past Surgical History:  Procedure Laterality Date  . FETAL SURGERY FOR CONGENITAL HERNIA      Prior to Admission medications   Medication Sig Start Date End Date Taking? Authorizing Provider  amoxicillin (AMOXIL) 500 MG capsule Take 1 capsule (500 mg total) by mouth 3 (three) times daily. 02/17/21  Yes Joni Reining, PA-C  ibuprofen (ADVIL) 800 MG tablet Take 1 tablet (800 mg total) by mouth every 8 (eight) hours as needed. 02/17/21  Yes Joni Reining, PA-C  oxyCODONE-acetaminophen (PERCOCET) 7.5-325 MG tablet Take 1 tablet by mouth every 6 (six) hours as needed for up to 3 days for severe pain. 02/17/21 02/20/21 Yes Joni Reining, PA-C  methocarbamol (ROBAXIN) 500 MG tablet Take 1 tablet (500 mg total) by mouth 2 (two) times daily. 03/23/20   Gailen Shelter, PA    Allergies Patient has no known allergies.  No family history on file.  Social  History Social History   Tobacco Use  . Smoking status: Current Every Day Smoker    Packs/day: 1.00    Types: Cigarettes  . Smokeless tobacco: Never Used  Substance Use Topics  . Alcohol use: Yes    Alcohol/week: 6.0 standard drinks    Types: 6 Cans of beer per week  . Drug use: No    Types: Cocaine    Comment: quit a year and 1/2 ago.     Review of Systems Constitutional: No fever/chills Eyes: No visual changes. ENT: No sore throat.  Left lower molar pain. Cardiovascular: Denies chest pain. Respiratory: Denies shortness of breath. Gastrointestinal: No abdominal pain.  No nausea, no vomiting.  No diarrhea.  No constipation. Genitourinary: Negative for dysuria. Musculoskeletal: Negative for back pain. Skin: Negative for rash. Neurological: Negative for headaches, focal weakness or numbness. __________________________________________   PHYSICAL EXAM:  VITAL SIGNS: ED Triage Vitals  Enc Vitals Group     BP 02/17/21 1222 118/76     Pulse Rate 02/17/21 1222 87     Resp --      Temp 02/17/21 1222 98.7 F (37.1 C)     Temp Source 02/17/21 1222 Oral     SpO2 02/17/21 1222 97 %     Weight 02/17/21 1223 165 lb (74.8 kg)     Height 02/17/21 1223 5\' 9"  (1.753 m)     Head Circumference --      Peak  Flow --      Pain Score 02/17/21 1223 4     Pain Loc --      Pain Edu? --      Excl. in GC? --     Constitutional: Alert and oriented. Well appearing and in no acute distress. Mouth/Throat: Mucous membranes are moist.  Oropharynx non-erythematous.  Over-the-counter dental To left lower molar. Neck: No stridor.  Hematological/Lymphatic/Immunilogical: No cervical lymphadenopathy. Cardiovascular: Normal rate, regular rhythm. Grossly normal heart sounds.  Good peripheral circulation. Respiratory: Normal respiratory effort.  No retractions. Lungs CTAB. Skin:  Skin is warm, dry and intact. No rash noted. Psychiatric: Mood and affect are normal. Speech and behavior are  normal.  ____________________________________________   LABS (all labs ordered are listed, but only abnormal results are displayed)  Labs Reviewed - No data to display ____________________________________________  EKG   ____________________________________________  RADIOLOGY I, Joni Reining, personally viewed and evaluated these images (plain radiographs) as part of my medical decision making, as well as reviewing the written report by the radiologist.  ED MD interpretation:    Official radiology report(s): No results found.  ____________________________________________   PROCEDURES  Procedure(s) performed (including Critical Care):  Procedures   ____________________________________________   INITIAL IMPRESSION / ASSESSMENT AND PLAN / ED COURSE  As part of my medical decision making, I reviewed the following data within the electronic MEDICAL RECORD NUMBER         Patient presents with dental pain and developed an abscess to the left lower molar.  Patient given discharge care instructions and advised to follow-up with  treating dentist.      ____________________________________________   FINAL CLINICAL IMPRESSION(S) / ED DIAGNOSES  Final diagnoses:  Dental abscess     ED Discharge Orders         Ordered    amoxicillin (AMOXIL) 500 MG capsule  3 times daily        02/17/21 1242    oxyCODONE-acetaminophen (PERCOCET) 7.5-325 MG tablet  Every 6 hours PRN        02/17/21 1242    ibuprofen (ADVIL) 800 MG tablet  Every 8 hours PRN        02/17/21 1242          *Please note:  [name] was evaluated in Emergency Department on 02/17/2021 for the symptoms described in the history of present illness. He was evaluated in the context of the global COVID-19 pandemic, which necessitated consideration that the patient might be at risk for infection with the SARS-CoV-2 virus that causes COVID-19. Institutional protocols and algorithms that pertain to the evaluation  of patients at risk for COVID-19 are in a state of rapid change based on information released by regulatory bodies including the CDC and federal and state organizations. These policies and algorithms were followed during the patient's care in the ED.  Some ED evaluations and interventions may be delayed as a result of limited staffing during and the pandemic.*   Note:  This document was prepared using Dragon voice recognition software and may include unintentional dictation errors.    Joni Reining, PA-C 02/17/21 1251    Sharman Cheek, MD 02/17/21 1539

## 2021-02-17 NOTE — ED Triage Notes (Signed)
Pt states he is here for dental pain that started 3 weeks ago and that he has been taking penicillin for 3 days- pt noticed an abscess this morning and has been using warm compresses

## 2021-02-17 NOTE — Discharge Instructions (Addendum)
Follow discharge care instruction take medication as directed.  Follow-up with your treating dentist for definitive evaluation and treatment.

## 2021-07-15 DIAGNOSIS — R220 Localized swelling, mass and lump, head: Secondary | ICD-10-CM | POA: Insufficient documentation

## 2021-07-15 DIAGNOSIS — M79602 Pain in left arm: Secondary | ICD-10-CM | POA: Insufficient documentation

## 2021-07-15 DIAGNOSIS — R519 Headache, unspecified: Secondary | ICD-10-CM | POA: Insufficient documentation

## 2021-07-15 DIAGNOSIS — Z5321 Procedure and treatment not carried out due to patient leaving prior to being seen by health care provider: Secondary | ICD-10-CM | POA: Insufficient documentation

## 2021-07-15 NOTE — ED Triage Notes (Signed)
L arm pain at antecubital area x1.28mo, repetitive motion at work. Also c/o slight headache past few days and noticed bump to R back of head, denies blurred vision.

## 2021-07-16 ENCOUNTER — Emergency Department
Admission: EM | Admit: 2021-07-16 | Discharge: 2021-07-16 | Disposition: A | Discharge status: Home / Self Care | Payer: Self-pay | Attending: Emergency Medicine | Admitting: Emergency Medicine

## 2021-07-24 ENCOUNTER — Emergency Department: Payer: No Typology Code available for payment source

## 2021-07-24 ENCOUNTER — Emergency Department
Admission: EM | Admit: 2021-07-24 | Discharge: 2021-07-24 | Disposition: A | Discharge status: Home / Self Care | Payer: No Typology Code available for payment source | Attending: Emergency Medicine | Admitting: Emergency Medicine

## 2021-07-24 ENCOUNTER — Encounter: Payer: Self-pay | Admitting: Emergency Medicine

## 2021-07-24 ENCOUNTER — Other Ambulatory Visit: Payer: Self-pay

## 2021-07-24 DIAGNOSIS — F17210 Nicotine dependence, cigarettes, uncomplicated: Secondary | ICD-10-CM | POA: Diagnosis not present

## 2021-07-24 DIAGNOSIS — Z23 Encounter for immunization: Secondary | ICD-10-CM | POA: Diagnosis not present

## 2021-07-24 DIAGNOSIS — G5622 Lesion of ulnar nerve, left upper limb: Secondary | ICD-10-CM | POA: Diagnosis not present

## 2021-07-24 DIAGNOSIS — R519 Headache, unspecified: Secondary | ICD-10-CM | POA: Insufficient documentation

## 2021-07-24 DIAGNOSIS — R202 Paresthesia of skin: Secondary | ICD-10-CM | POA: Diagnosis present

## 2021-07-24 MED ORDER — TETANUS-DIPHTH-ACELL PERTUSSIS 5-2.5-18.5 LF-MCG/0.5 IM SUSY
0.5000 mL | PREFILLED_SYRINGE | Freq: Once | INTRAMUSCULAR | Status: AC
  Administered 2021-07-24: 0.5 mL via INTRAMUSCULAR
  Filled 2021-07-24: qty 0.5

## 2021-07-24 MED ORDER — IBUPROFEN 200 MG PO TABS: 400.0000 mg | ORAL_TABLET | Freq: Four times a day (QID) | ORAL | 0 refills | Status: AC | PRN

## 2021-07-24 MED ORDER — IBUPROFEN 400 MG PO TABS
400.0000 mg | ORAL_TABLET | Freq: Once | ORAL | Status: AC
  Administered 2021-07-24: 400 mg via ORAL
  Filled 2021-07-24: qty 1

## 2021-07-24 NOTE — ED Triage Notes (Signed)
Pt to ED via POV c/o frontal headache, cyst on the back of his head, and left arm pain that has been going on for about 2 months. Pt is in NAD at this time.

## 2021-07-24 NOTE — Discharge Instructions (Addendum)
I suspect that you have compression of your ulnar nerve at your elbow which is causing your numbness.  When you are working, please try to avoid putting compression on the elbow.  You can also try to put a towel under the elbow or wrap the elbow when you are sleeping.  If this continues to cause you discomfort, please follow-up with a neurologist.

## 2021-07-24 NOTE — ED Provider Notes (Signed)
[facility]  ____________________________________________   Event Date/Time   First MD Initiated Contact with Patient 07/24/21 1023     (approximate)  I have reviewed the triage vital signs and the nursing notes.   HISTORY  Chief Complaint Arm Pain and Headache    HPI Sean Khan is a 40 y.o. male presenting with left arm numbness and posterior headache.  Patient notes that over the past week or so he has had intermittent numbness and tingling of the left lower arm.  There is really no associated pain or weakness.  It comes and goes, also thinks that his veins around the left elbow are harder compared to the right.  Denies any neck pain or injury.  Patient also complains of the posterior headache and some swelling along the back of his head has been going on for several weeks.  Denies any visual change, numbness or weakness.  No nausea vomiting.         Past Medical History:  Diagnosis Date   Pericarditis    Pericarditis    Substance abuse (HCC)    Tobacco, cocaine    Patient Active Problem List   Diagnosis Date Noted   Counseling on substance use and abuse 03/02/2013   Onychomycosis with ingrown toenail 03/02/2013   Acute pericarditis, unspecified 03/01/2013    Past Surgical History:  Procedure Laterality Date   FETAL SURGERY FOR CONGENITAL HERNIA      Prior to Admission medications   Medication Sig Start Date End Date Taking? Authorizing Provider  ibuprofen (MOTRIN IB) 200 MG tablet Take 2 tablets (400 mg total) by mouth every 6 (six) hours as needed. 07/24/21 08/23/21 Yes Georga Hacking, MD  ibuprofen (ADVIL) 800 MG tablet Take 1 tablet (800 mg total) by mouth every 8 (eight) hours as needed. 02/17/21   Joni Reining, PA-C    Allergies Patient has no known allergies.  No family history on file.  Social History Social History   Tobacco Use   Smoking status: Every Day    Packs/day: 1.00    Types: Cigarettes   Smokeless  tobacco: Never  Substance Use Topics   Alcohol use: Yes    Alcohol/week: 6.0 standard drinks    Types: 6 Cans of beer per week   Drug use: No    Types: Cocaine    Comment: quit a year and 1/2 ago.     Review of Systems   Review of Systems  Constitutional:  Negative for chills and fever.  Eyes:  Negative for visual disturbance.  Musculoskeletal:  Positive for arthralgias.  Neurological:  Positive for numbness and headaches. Negative for weakness.  All other systems reviewed and are negative.  Physical Exam Updated Vital Signs BP (!) 128/95 (BP Location: Right Arm)   Pulse 76   Temp 98.2 F (36.8 C) (Oral)   Resp 16   Ht 5\' 9"  (1.753 m)   Wt 74.8 kg   SpO2 97%   BMI 24.37 kg/m   Physical Exam Vitals and nursing note reviewed.  Constitutional:      General: He is not in acute distress.    Appearance: Normal appearance.  HENT:     Head: Normocephalic and atraumatic.     Comments: Significant swelling or tenderness noted in the posterior occiput Eyes:     General: No scleral icterus.    Conjunctiva/sclera: Conjunctivae normal.  Pulmonary:     Effort: Pulmonary effort is normal. No respiratory distress.     Breath  sounds: Normal breath sounds. No wheezing.  Musculoskeletal:        General: No deformity or signs of injury.     Cervical back: Normal range of motion.  Skin:    Coloration: Skin is not jaundiced or pale.  Neurological:     General: No focal deficit present.     Mental Status: He is alert and oriented to person, place, and time. Mental status is at baseline.     Comments: 5 out of 5 strength with elbow flexion, extension, handgrip, wrist extension, finger abduction, okay sign, thumbs up Subjective decrease sensation over the fourth and fifth digits Normal sensation in the medial and radial distribution  Psychiatric:        Mood and Affect: Mood normal.        Behavior: Behavior normal.     LABS (all labs ordered are listed, but only abnormal results  are displayed)  Labs Reviewed - No data to display ____________________________________________  EKG  N/a ____________________________________________  RADIOLOGY Ky Barban, personally viewed and evaluated these images (plain radiographs) as part of my medical decision making, as well as reviewing the written report by the radiologist.  ED MD interpretation: I reviewed the x-ray of the elbow which does not show any acute fracture dislocation    ____________________________________________   PROCEDURES  Procedure(s) performed (including Critical Care):  Procedures   ____________________________________________   INITIAL IMPRESSION / ASSESSMENT AND PLAN / ED COURSE     40 year old male presents with 2 separate symptoms of posterior headache and left arm numbness.  His arm numbness is in the distribution of the ulnar nerve and he also has some pain around the medial elbow.  I suspect ulnar neuropathy related to compression.  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  Denies any prolonged compression of the area.  He has no focal weakness.  Also complains of some swelling to the back of his head and a posterior headache for the past several weeks.  I am not able to appreciate any swelling on exam.  Rest of his neurologic exam is within normal limits.  Will treat supportively with Motrin.  I advised that the patient avoid compression at the elbow which could be contributing to his ulnar neuropathy and to take NSAIDs for pain.  Can follow-up with neurology if his symptoms are refractory.      ____________________________________________   FINAL CLINICAL IMPRESSION(S) / ED DIAGNOSES  Final diagnoses:  Ulnar neuropathy at elbow of left upper extremity  Occipital headache     ED Discharge Orders          Ordered    ibuprofen (MOTRIN IB) 200 MG tablet  Every 6 hours PRN        07/24/21 1046             Note:  This document was  prepared using Dragon voice recognition software and may include unintentional dictation errors.    Georga Hacking, MD 07/24/21 [phone]
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
## 2021-09-12 ENCOUNTER — Other Ambulatory Visit: Payer: Self-pay

## 2021-09-12 ENCOUNTER — Encounter: Payer: Self-pay | Admitting: Emergency Medicine

## 2021-09-12 DIAGNOSIS — S0993XA Unspecified injury of face, initial encounter: Secondary | ICD-10-CM | POA: Diagnosis present

## 2021-09-12 DIAGNOSIS — S025XXA Fracture of tooth (traumatic), initial encounter for closed fracture: Secondary | ICD-10-CM | POA: Diagnosis not present

## 2021-09-12 DIAGNOSIS — F17210 Nicotine dependence, cigarettes, uncomplicated: Secondary | ICD-10-CM | POA: Diagnosis not present

## 2021-09-12 DIAGNOSIS — X58XXXA Exposure to other specified factors, initial encounter: Secondary | ICD-10-CM | POA: Diagnosis not present
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
## 2021-09-13 ENCOUNTER — Emergency Department
Admission: EM | Admit: 2021-09-13 | Discharge: 2021-09-13 | Disposition: A | Discharge status: Home / Self Care | Payer: No Typology Code available for payment source | Attending: Emergency Medicine | Admitting: Emergency Medicine

## 2021-09-13 DIAGNOSIS — S025XXA Fracture of tooth (traumatic), initial encounter for closed fracture: Secondary | ICD-10-CM

## 2021-09-13 DIAGNOSIS — K0889 Other specified disorders of teeth and supporting structures: Secondary | ICD-10-CM

## 2021-09-13 MED ORDER — IBUPROFEN 800 MG PO TABS
800.0000 mg | ORAL_TABLET | Freq: Three times a day (TID) | ORAL | 0 refills | Status: DC | PRN
Start: 2021-09-13 — End: 2024-02-10

## 2021-09-13 MED ORDER — AMOXICILLIN-POT CLAVULANATE 875-125 MG PO TABS
1.0000 | ORAL_TABLET | Freq: Once | ORAL | Status: AC
  Administered 2021-09-13: 03:00:00 1 via ORAL
  Filled 2021-09-13: qty 1

## 2021-09-13 MED ORDER — LIDOCAINE VISCOUS HCL 2 % MT SOLN
15.0000 mL | OROMUCOSAL | 0 refills | Status: DC | PRN
Start: 2021-09-13 — End: 2024-02-10

## 2021-09-13 MED ORDER — AMOXICILLIN 500 MG PO TABS
500.0000 mg | ORAL_TABLET | Freq: Two times a day (BID) | ORAL | 0 refills | Status: AC
Start: 2021-09-13 — End: 2021-09-20

## 2021-09-13 MED ORDER — IBUPROFEN 800 MG PO TABS
800.0000 mg | ORAL_TABLET | Freq: Once | ORAL | Status: AC
  Administered 2021-09-13: 03:00:00 800 mg via ORAL
  Filled 2021-09-13: qty 1

## 2021-09-13 NOTE — Discharge Instructions (Signed)
OPTIONS FOR DENTAL FOLLOW UP CARE ° °[place] Department of Health and Human Services - Local Safety Net Dental Clinics °http://www.ncdhhs.gov/dph/oralhealth/services/safetynetclinics.htm °  °[facility] (336-562-3123) ° °[facility] [place] ([phone]) ° °[facility] ([phone] ext [phone]) ° °[facility] ([phone]) ° °[facility] ([phone]) °This clinic caters to the indigent population and is on a lottery system. °Location: °[facility], [address], [place] °Clinic Hours: °Wednesdays from 6pm - 9pm, patients seen by a lottery system. °For dates, call or go to [email or web address].[facility].edu/[facility]/patients/Dental-[facility] °Services: °Cleanings, fillings and simple extractions. °Payment Options: °DENTAL WORK IS FREE OF CHARGE. Bring proof of income or support. °Best way to get seen: °Arrive at 5:15 pm - this is a lottery, NOT first come/first serve, so arriving earlier will not increase your chances of being seen. °  °  °[facility] °[phone] °Select option 1 for emergencies °  °Location: °[facility], [address], [place] °Clinic Hours: °No walk-ins accepted - call the day before to schedule an appointment. °Check in times are 9:30 am and 1:30 pm. °Services: °Simple extractions, temporary fillings, pulpectomy/pulp debridement, uncomplicated abscess drainage. °Payment Options: °PAYMENT IS DUE AT THE TIME OF SERVICE.  Fee is usually $100-200, additional surgical procedures (e.g. abscess drainage) may be extra. °Cash, checks, Visa/MasterCard accepted.  Can file Medicaid if patient is covered for dental - patient should call case worker to check. °No discount for [facility] patients. °Best way to get seen: °MUST call the day before and get onto the schedule. Can usually be seen the next 1-2 days. No walk-ins accepted. °  °  °[facility] °[phone] °   °Location: °[facility], [address], [place] °Clinic Hours: °M, W, Th, F 8am or 1:30pm, Tues 9a or 1:30 - first come/first served. °Services: °Simple extractions, temporary fillings, uncomplicated abscess drainage.  You do not need to be an [place] resident. °Payment Options: °PAYMENT IS DUE AT THE TIME OF SERVICE. °Dental insurance, otherwise sliding scale - bring proof of income or support. °Depending on income and treatment needed, cost is usually $50-200. °Best way to get seen: °Arrive early as it is first come/first served. °  °  °[facility] °[phone] °  °Location: °[address] °Clinic Hours: °Mon-Thu 8a-5p °Services: °Most basic dental services including extractions and fillings. °Payment Options: °PAYMENT IS DUE AT THE TIME OF SERVICE. °Sliding scale, up to 50% off - bring proof if income or support. °Medicaid with dental option accepted. °Best way to get seen: °Call to schedule an appointment, can usually be seen within 2 weeks OR they will try to see walk-ins - show up at 8a or 2p (you may have to wait). °  °  °[facility] °919-245-2435 °[place] RESIDENTS ONLY °  °Location: °[facility], [address], [place], [place] [address] °Clinic Hours: By appointment only. °Monday - Thursday 8am-5pm, Friday 8am-12pm °Services: Cleanings, fillings, extractions. °Payment Options: °PAYMENT IS DUE AT THE TIME OF SERVICE. °Cash, Visa or MasterCard. Sliding scale - $30 minimum per service. °Best way to get seen: °Come in to office, complete packet and make an appointment - need proof of income °or support monies for each household member and proof of [place] residence. °Usually takes about a month to get in. °  °  °[facility] °[phone] °  °Location: °[address]   [place] °Clinic Hours: Walk-in Urgent Care Dental Services are offered Monday-Friday  mornings only. °The numbers of emergencies accepted daily is limited to the number of °providers available. °Maximum 15 - Mondays, Wednesdays & Thursdays °Maximum 10 - Tuesdays & Fridays °Services: °You do not need to be a [place] resident to be seen for a dental emergency. °Emergencies are defined as pain, swelling, abnormal bleeding, or dental trauma. Walkins will receive x-rays if needed. °NOTE: Dental cleaning is not an emergency. °Payment Options: °PAYMENT IS DUE AT THE TIME OF SERVICE. °Minimum co-pay is $40.00 for uninsured patients. °Minimum co-pay is $3.00 for Medicaid with dental coverage. °Dental Insurance is accepted and must be presented at time of visit. °Medicare does not cover dental. °Forms of payment: Cash, credit card, checks. °Best way to get seen: °If not previously registered with the clinic, walk-in dental registration begins at 7:15 am and is on a first come/first serve basis. °If previously registered with the clinic, call to make an appointment. °  °  °[facility] °[phone] °[place] RESIDENTS ONLY °  °Location: °[address], [place], [place] °Clinic Hours: °Mon-Thu 10a-2p °Services: Extractions only! °Payment Options: °FREE (donations accepted) - bring proof of income or support °Best way to get seen: °Call and schedule an appointment OR come at 8am on the 1st Monday of every month (except for holidays) when it is first come/first served. °  °  °[facility] °[phone] °  °Location: °[address], [place] °Clinic Hours: °Friday mornings °Services, Payment Options, Best way to get seen: °Call for info °

## 2021-09-13 NOTE — ED Provider Notes (Signed)
[facility] Emergency Department Provider Note  ____________________________________________  Time seen: Approximately 2:42 AM  I have reviewed the triage vital signs and the nursing notes.   HISTORY  Chief Complaint Dental Pain   HPI Sean Khan is a 40 y.o. male who presents for evaluation of tooth pain.  Patient reports that he broke his tooth several years ago.  Over the last 2 days has been having pain.  He applied over-the-counter paste that he bought at [facility] but continues to have pain. He is concerned that the tooth may be getting infected.  He has not seen a dentist.  No facial or neck swelling, no fever, no difficulty swallowing   Past Medical History:  Diagnosis Date   Pericarditis    Pericarditis    Substance abuse (HCC)    Tobacco, cocaine    Patient Active Problem List   Diagnosis Date Noted   Counseling on substance use and abuse 03/02/2013   Onychomycosis with ingrown toenail 03/02/2013   Acute pericarditis, unspecified 03/01/2013    Past Surgical History:  Procedure Laterality Date   FETAL SURGERY FOR CONGENITAL HERNIA      Prior to Admission medications   Medication Sig Start Date End Date Taking? Authorizing Provider  amoxicillin (AMOXIL) 500 MG tablet Take 1 tablet (500 mg total) by mouth 2 (two) times daily for 7 days. 09/13/21 09/20/21 Yes Chrishana Spargur, [place], MD  ibuprofen (ADVIL) 800 MG tablet Take 1 tablet (800 mg total) by mouth every 8 (eight) hours as needed. 09/13/21  Yes Teriana Danker, [place], MD  lidocaine (XYLOCAINE) 2 % solution Use as directed 15 mLs in the mouth or throat as needed for mouth pain. 09/13/21  Yes Nita Sickle, MD    Allergies Patient has no known allergies.  History reviewed. No pertinent family history.  Social History Social History   Tobacco Use   Smoking status: Every Day    Packs/day: 1.00    Types: Cigarettes   Smokeless tobacco: Never  Substance Use Topics   Alcohol use:  Yes    Alcohol/week: 6.0 standard drinks    Types: 6 Cans of beer per week   Drug use: No    Types: Cocaine    Comment: quit a year and 1/2 ago.     Review of Systems  Constitutional: Negative for fever. Eyes: Negative for visual changes. ENT: Negative for sore throat. + dental pain Neck: No neck pain  Cardiovascular: Negative for chest pain. Respiratory: Negative for shortness of breath. Gastrointestinal: Negative for abdominal pain, vomiting or diarrhea. Genitourinary: Negative for dysuria. Musculoskeletal: Negative for back pain. Skin: Negative for rash. Neurological: Negative for headaches, weakness or numbness. Psych: No SI or HI  ____________________________________________   PHYSICAL EXAM:  VITAL SIGNS: ED Triage Vitals  Enc Vitals Group     BP 09/12/21 2202 (!) 156/104     Pulse Rate 09/12/21 2202 82     Resp 09/12/21 2202 14     Temp 09/12/21 2202 98.4 F (36.9 C)     Temp Source 09/12/21 2202 Oral     SpO2 09/12/21 2202 98 %     Weight 09/12/21 2203 165 lb (74.8 kg)     Height 09/12/21 2203 5\' 9"  (1.753 m)     Head Circumference --      Peak Flow --      Pain Score 09/12/21 2203 7     Pain Loc --      Pain Edu? --  Excl. in [place]? --     Constitutional: Alert and oriented. Well appearing and in no apparent distress. HEENT:      Head: Normocephalic and atraumatic.         Eyes: Conjunctivae are normal. Sclera is non-icteric.       Mouth/Throat: Mucous membranes are moist.  Patient has pain on the left lower molars which are covered by a white paste therefore limiting the exam.  There is no signs of abscess, gingivitis, Ludewig's angina      Neck: Supple with no signs of meningismus. Cardiovascular: Regular rate and rhythm.  Respiratory: Normal respiratory effort.  Neurologic: Normal speech and language. Face is symmetric. Moving all extremities. No gross focal neurologic deficits are appreciated. Skin: Skin is warm, dry and intact. No rash  noted. Psychiatric: Mood and affect are normal. Speech and behavior are normal.  ____________________________________________   LABS (all labs ordered are listed, but only abnormal results are displayed)  Labs Reviewed - No data to display ____________________________________________  EKG  none  ____________________________________________  RADIOLOGY  none  ____________________________________________   PROCEDURES  Procedure(s) performed: None Procedures   Critical Care performed:  None ____________________________________________   INITIAL IMPRESSION / ASSESSMENT AND PLAN / ED COURSE  40 y.o. male who presents for evaluation of tooth pain.  No signs of abscess, Ludewig's angina, or gingivitis.  Recommended that he sees a dentist.  Viscous lidocaine and amoxicillin provided.  Discussed my standard return precautions      _____________________________________________ Please note:  Patient was evaluated in Emergency Department today for the symptoms described in the history of present illness. Patient was evaluated in the context of the global COVID-19 pandemic, which necessitated consideration that the patient might be at risk for infection with the SARS-CoV-2 virus that causes COVID-19. Institutional protocols and algorithms that pertain to the evaluation of patients at risk for COVID-19 are in a state of rapid change based on information released by regulatory bodies including the CDC and federal and state organizations. These policies and algorithms were followed during the patient's care in the ED.  Some ED evaluations and interventions may be delayed as a result of limited staffing during the pandemic.   [place] Controlled Substance Database was reviewed by me. ____________________________________________   FINAL CLINICAL IMPRESSION(S) / ED DIAGNOSES   Final diagnoses:  Closed fracture of tooth, initial encounter  Tooth pain      NEW MEDICATIONS STARTED DURING  THIS VISIT:  ED Discharge Orders          Ordered    amoxicillin (AMOXIL) 500 MG tablet  2 times daily        09/13/21 0241    lidocaine (XYLOCAINE) 2 % solution  As needed        09/13/21 0241    ibuprofen (ADVIL) 800 MG tablet  Every 8 hours PRN        09/13/21 0241             Note:  This document was prepared using Dragon voice recognition software and may include unintentional dictation errors.    Rudene Re, MD 09/13/21 [phone]

## 2022-03-26 ENCOUNTER — Other Ambulatory Visit: Payer: Self-pay

## 2022-03-26 DIAGNOSIS — K0889 Other specified disorders of teeth and supporting structures: Secondary | ICD-10-CM | POA: Insufficient documentation

## 2022-03-27 ENCOUNTER — Encounter (HOSPITAL_BASED_OUTPATIENT_CLINIC_OR_DEPARTMENT_OTHER): Payer: Self-pay

## 2022-03-27 ENCOUNTER — Other Ambulatory Visit: Payer: Self-pay

## 2022-03-27 ENCOUNTER — Emergency Department (HOSPITAL_BASED_OUTPATIENT_CLINIC_OR_DEPARTMENT_OTHER)
Admission: EM | Admit: 2022-03-27 | Discharge: 2022-03-27 | Disposition: A | Discharge status: Home / Self Care | Payer: No Typology Code available for payment source | Attending: Emergency Medicine | Admitting: Emergency Medicine

## 2022-03-27 DIAGNOSIS — K0889 Other specified disorders of teeth and supporting structures: Secondary | ICD-10-CM

## 2022-03-27 MED ORDER — CLINDAMYCIN HCL 300 MG PO CAPS: 300.0000 mg | ORAL_CAPSULE | Freq: Four times a day (QID) | ORAL | 0 refills | Status: DC

## 2022-03-27 MED ORDER — CLINDAMYCIN HCL 150 MG PO CAPS
300.0000 mg | ORAL_CAPSULE | Freq: Once | ORAL | Status: AC
  Administered 2022-03-27: 300 mg via ORAL
  Filled 2022-03-27: qty 2

## 2022-03-27 MED ORDER — HYDROCODONE-ACETAMINOPHEN 5-325 MG PO TABS: 1.0000 | ORAL_TABLET | Freq: Four times a day (QID) | ORAL | 0 refills | Status: DC | PRN

## 2022-03-27 MED ORDER — HYDROCODONE-ACETAMINOPHEN 5-325 MG PO TABS
2.0000 | ORAL_TABLET | Freq: Once | ORAL | Status: AC
  Administered 2022-03-27: 2 via ORAL
  Filled 2022-03-27: qty 2

## 2022-03-27 NOTE — Discharge Instructions (Signed)
Begin taking clindamycin as prescribed.  Continue taking Aleve as before.  Begin taking hydrocodone as prescribed as needed for pain not relieved with Aleve.  Follow-up with dentistry as scheduled, sooner if you experience additional problems.

## 2022-03-27 NOTE — ED Triage Notes (Signed)
Left lower dental molar pain. Chipped tooth.   Dental appt for next week. Tool Aleve with no improvement.

## 2022-03-27 NOTE — ED Provider Notes (Signed)
  [facility] EMERGENCY DEPT Provider Note   CSN: [id number] Arrival date & time: 03/26/22  2344     History  Chief Complaint  Patient presents with   Dental Pain    Sean Khan is a 41 y.o. male.  Patient is a 41 year old male with no significant past medical history.  Patient presenting today with complaints of dental pain.  He describes a throbbing pain to his left lower molars.  He has an area that has been cracked for several years.  He states that he injured it initially trying to open a beer bottle with his teeth.  He has an appointment to see a dentist next week, however the pain has become worse.  He denies any difficulty breathing or swallowing.  He does describe some swelling under his jaw.  He has been taking Aleve with little relief.  The history is provided by the patient.      Home Medications Prior to Admission medications   Medication Sig Start Date End Date Taking? Authorizing Provider  ibuprofen (ADVIL) 800 MG tablet Take 1 tablet (800 mg total) by mouth every 8 (eight) hours as needed. 09/13/21   Alfred Levins, [place], MD  lidocaine (XYLOCAINE) 2 % solution Use as directed 15 mLs in the mouth or throat as needed for mouth pain. 09/13/21   Rudene Re, MD      Allergies    Patient has no known allergies.    Review of Systems   Review of Systems  All other systems reviewed and are negative.  Physical Exam Updated Vital Signs BP 133/90   Pulse 73   Temp 98.1 F (36.7 C) (Oral)   Resp 18   Ht 5\' 9"  (1.753 m)   Wt 74.8 kg   SpO2 99%   BMI 24.37 kg/m  Physical Exam Vitals and nursing note reviewed.  Constitutional:      General: He is not in acute distress.    Appearance: Normal appearance. He is not ill-appearing.  HENT:     Head: Normocephalic and atraumatic.     Mouth/Throat:     Mouth: Mucous membranes are moist.     Comments: The second molar is noted to have significant decay.  There is some surrounding gingival  inflammation, but no obvious abscess. Pulmonary:     Effort: Pulmonary effort is normal.  Skin:    General: Skin is warm and dry.  Neurological:     Mental Status: He is alert and oriented to person, place, and time.    ED Results / Procedures / Treatments   Labs (all labs ordered are listed, but only abnormal results are displayed) Labs Reviewed - No data to display  EKG None  Radiology No results found.  Procedures Procedures    Medications Ordered in ED Medications  HYDROcodone-acetaminophen (NORCO/VICODIN) 5-325 MG per tablet 2 tablet (has no administration in time range)  clindamycin (CLEOCIN) capsule 300 mg (has no administration in time range)    ED Course/ Medical Decision Making/ A&P  Patient presenting with dental pain.  There is no obvious abscess that needs drained.  Patient will be treated with clindamycin and pain medicine.  To follow-up with dentistry for additional problems.  Final Clinical Impression(s) / ED Diagnoses Final diagnoses:  None    Rx / DC Orders ED Discharge Orders     None         Veryl Speak, MD 03/27/22 [phone]

## 2023-01-03 IMAGING — CR DG ELBOW COMPLETE 3+V*L*
4 series · 4 of 4 positions shown · non-contrast
Comparison: None.

CLINICAL DATA: Pain. Medial elbow pain for several weeks with
hardness of blood vessels.

EXAM:
LEFT ELBOW - COMPLETE 3+ VIEW

[elbow ap]
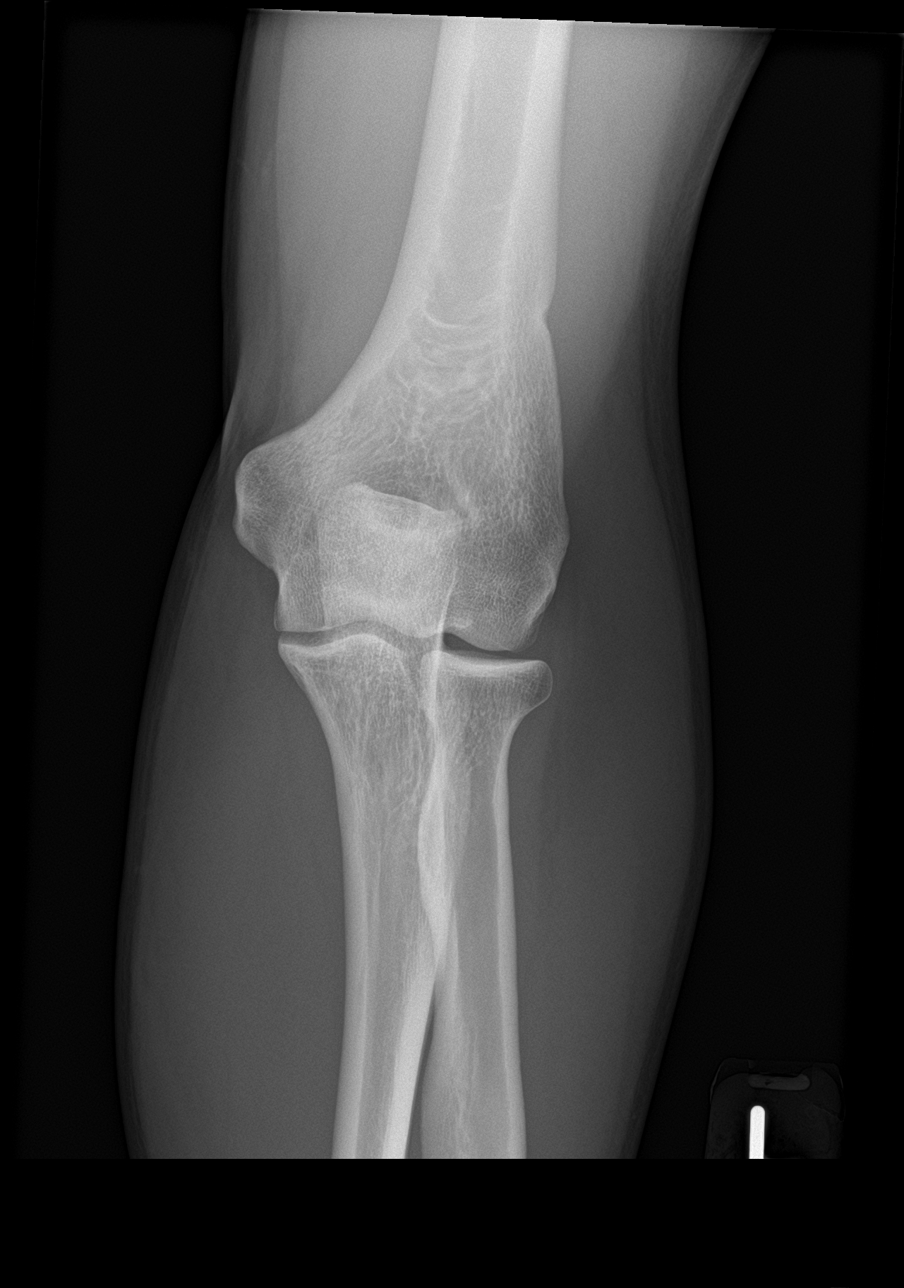

[elbow obl (1 of 2)]
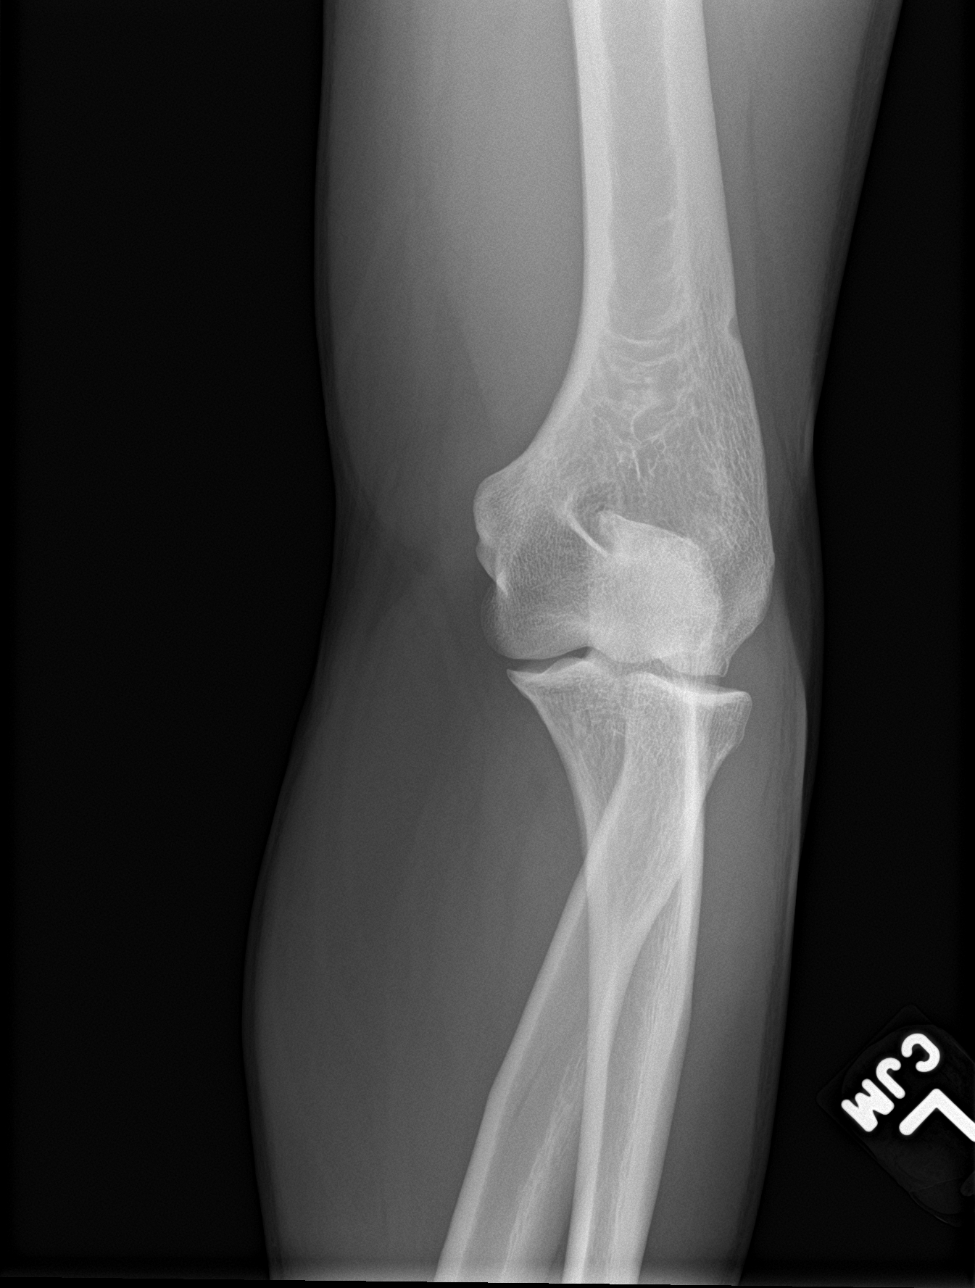

[elbow obl (2 of 2)]
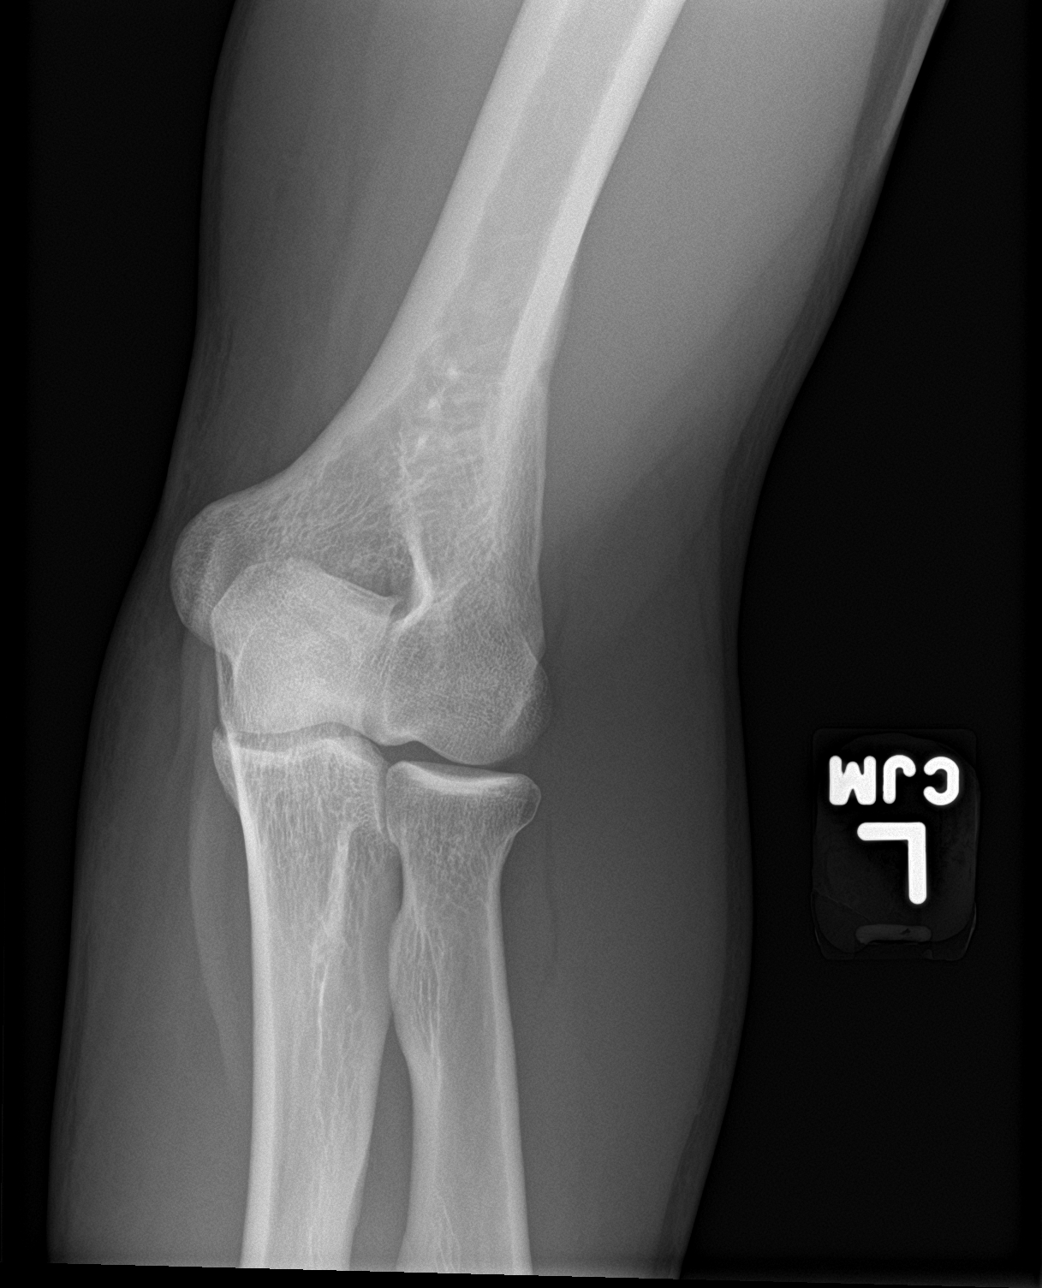

[elbow lat]
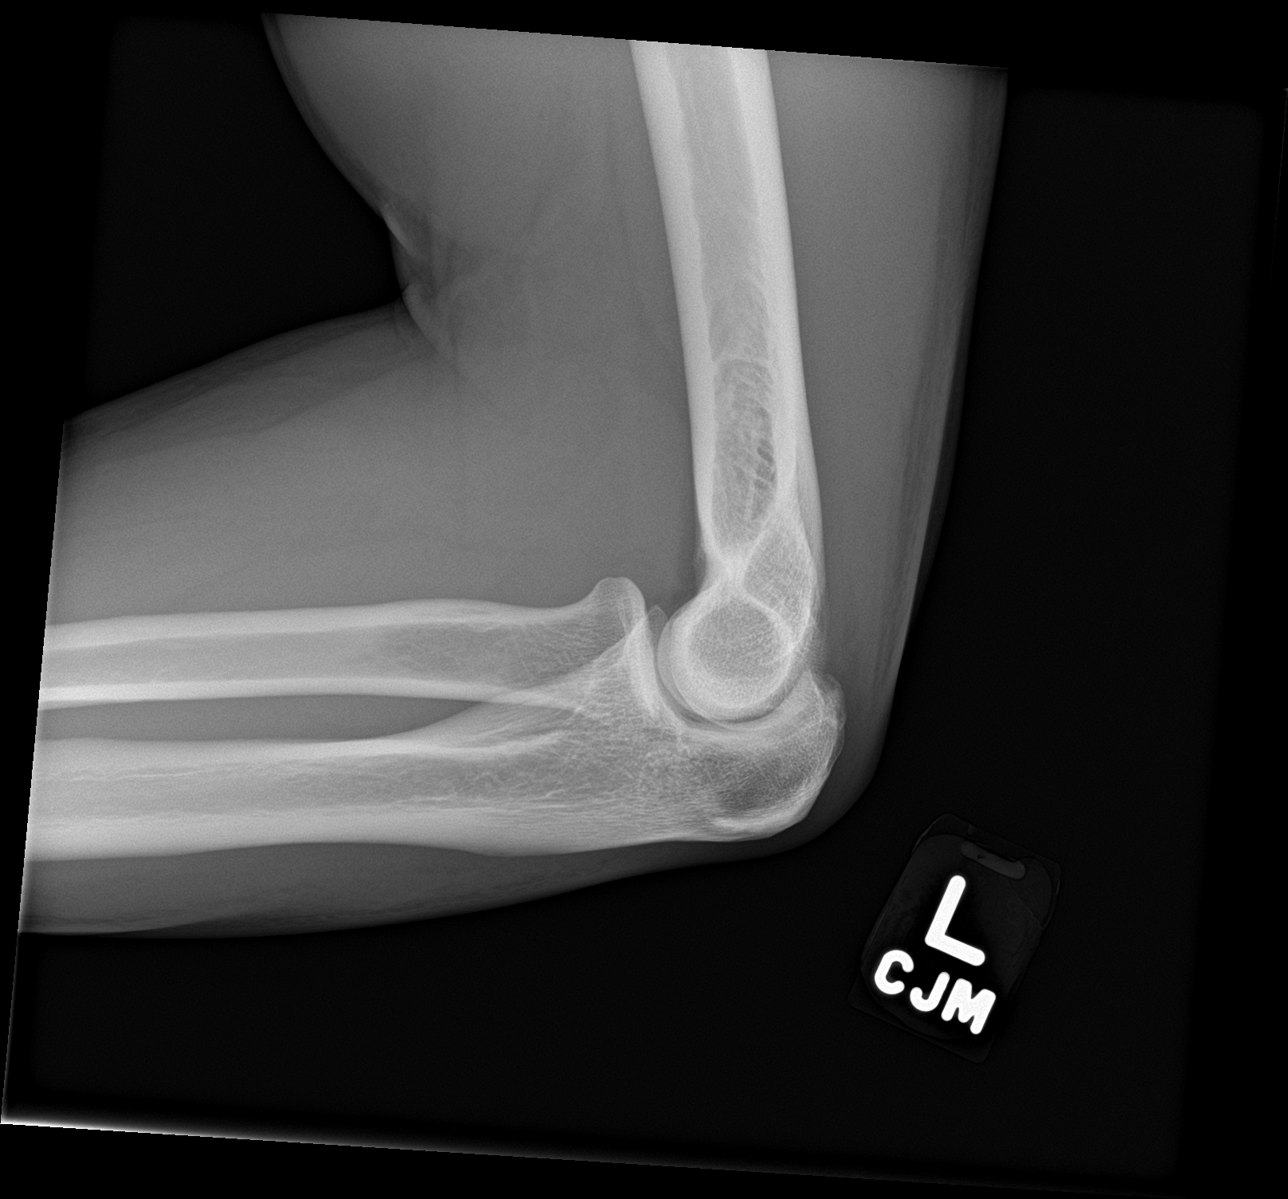

[4 of 4 positions shown; findings below may reference images not displayed]

FINDINGS: There is no evidence of fracture, dislocation, or joint effusion.
There is no evidence of arthropathy or other focal bone abnormality.
Soft tissues are unremarkable.
IMPRESSION: Negative.

## 2024-02-10 ENCOUNTER — Emergency Department (HOSPITAL_BASED_OUTPATIENT_CLINIC_OR_DEPARTMENT_OTHER)
Admission: EM | Admit: 2024-02-10 | Discharge: 2024-02-10 | Disposition: A | Discharge status: Home / Self Care | Payer: Self-pay | Attending: Emergency Medicine | Admitting: Emergency Medicine

## 2024-02-10 ENCOUNTER — Encounter (HOSPITAL_BASED_OUTPATIENT_CLINIC_OR_DEPARTMENT_OTHER): Payer: Self-pay

## 2024-02-10 ENCOUNTER — Other Ambulatory Visit: Payer: Self-pay

## 2024-02-10 ENCOUNTER — Emergency Department (HOSPITAL_BASED_OUTPATIENT_CLINIC_OR_DEPARTMENT_OTHER): Payer: Self-pay | Admitting: Radiology

## 2024-02-10 DIAGNOSIS — M7022 Olecranon bursitis, left elbow: Secondary | ICD-10-CM | POA: Insufficient documentation

## 2024-02-10 DIAGNOSIS — Y939 Activity, unspecified: Secondary | ICD-10-CM | POA: Insufficient documentation

## 2024-02-10 NOTE — ED Triage Notes (Signed)
 Patient presents with fluid over the left elbow. Denies injury or trauma to the elbow. State he noticed the swelling 4 days ago. Patient took ibuprofen once.

## 2024-02-10 NOTE — Discharge Instructions (Addendum)
 Apply ice for 30 minutes at a time, 4 times a day.  Take an anti-inflammatory medication such as ibuprofen or naproxen.  You may also use a topical anti-inflammatory medication such as diclofenac (Voltaren) gel.  If the swollen area gets red and painful, come back so that it can be drained.  Also, if it gets red and painful, there is a good chance that there is infection there which would need antibiotics.

## 2024-02-10 NOTE — ED Provider Notes (Signed)
  [place] EMERGENCY DEPARTMENT AT [facility] Provider Note   CSN: [id number] Arrival date & time: 02/10/24  0002     History  Chief Complaint  Patient presents with   Elbow Pain    Sean Khan is a 43 y.o. male.  The history is provided by the patient.  He noted painless swelling of his right elbow about 1 week ago.  He denies any trauma.   Home Medications Prior to Admission medications   Not on File      Allergies    Patient has no known allergies.    Review of Systems   Review of Systems  All other systems reviewed and are negative.   Physical Exam Updated Vital Signs BP (!) 141/102   Pulse 77   Temp 98 F (36.7 C)   Resp 18   Ht 5\' 9"  (1.753 m)   Wt 81.6 kg   SpO2 98%   BMI 26.58 kg/m  Physical Exam Vitals and nursing note reviewed.   43 year old male, resting comfortably and in no acute distress. Vital signs are significant for mildly elevated blood pressure. Oxygen saturation is 98%, which is normal. Head is normocephalic and atraumatic. PERRLA, EOMI.  Lungs are clear without rales, wheezes, or rhonchi. Chest is nontender. Heart has regular rate and rhythm without murmur. Extremities: There is moderate swelling of the left olecranon bursa.  There is no erythema or warmth or tenderness.  There is full passive range of motion of the left elbow without pain. Skin is warm and dry without rash. Neurologic: Mental status is normal, moves all extremities equally.  ED Results / Procedures / Treatments    Radiology DG Elbow Complete Left Result Date: 02/10/2024 CLINICAL DATA:  Elbow swelling for several days, no known injury, initial encounter EXAM: LEFT ELBOW - COMPLETE 3+ VIEW COMPARISON:  07/24/2021 FINDINGS: No acute fracture or dislocation is noted. No joint effusion is seen. Soft tissue swelling is noted over the olecranon most consistent with olecranon bursitis. IMPRESSION: Changes suggestive of olecranon bursitis. Electronically Signed    By: Violeta Grey M.D.   On: 02/10/2024 00:50    Procedures Procedures    Medications Ordered in ED Medications - No data to display  ED Course/ Medical Decision Making/ A&P                                 Medical Decision Making Amount and/or Complexity of Data Reviewed Radiology: ordered.   Left olecranon bursitis.  X-rays of the left elbow show no evidence of fracture, probable olecranon bursitis.  I have independently viewed the images, and agree with radiologist's interpretation.  I discussed possible drainage of the olecranon bursa for diagnosis and symptomatic relief.  However, patient has declined the procedure.  I have advised him to apply ice, use over-the-counter NSAIDs and topical NSAIDs.  Return if any signs or symptoms of infection develop.  Final Clinical Impression(s) / ED Diagnoses Final diagnoses:  Olecranon bursitis of left elbow    Rx / DC Orders ED Discharge Orders     None         Alissa April, MD 02/10/24 [phone]

## 2024-06-12 ENCOUNTER — Emergency Department: Payer: Self-pay

## 2024-06-12 ENCOUNTER — Emergency Department
Admission: EM | Admit: 2024-06-12 | Discharge: 2024-06-12 | Disposition: A | Discharge status: Home / Self Care | Payer: Self-pay | Attending: Emergency Medicine | Admitting: Emergency Medicine

## 2024-06-12 ENCOUNTER — Other Ambulatory Visit: Payer: Self-pay

## 2024-06-12 DIAGNOSIS — Z23 Encounter for immunization: Secondary | ICD-10-CM | POA: Insufficient documentation

## 2024-06-12 DIAGNOSIS — W260XXA Contact with knife, initial encounter: Secondary | ICD-10-CM | POA: Insufficient documentation

## 2024-06-12 DIAGNOSIS — S61211A Laceration without foreign body of left index finger without damage to nail, initial encounter: Secondary | ICD-10-CM | POA: Insufficient documentation

## 2024-06-12 MED ORDER — TETANUS-DIPHTH-ACELL PERTUSSIS 5-2.5-18.5 LF-MCG/0.5 IM SUSY
0.5000 mL | PREFILLED_SYRINGE | Freq: Once | INTRAMUSCULAR | Status: AC
  Administered 2024-06-12: 0.5 mL via INTRAMUSCULAR
  Filled 2024-06-12: qty 0.5

## 2024-06-12 MED ORDER — LIDOCAINE HCL (PF) 1 % IJ SOLN
5.0000 mL | Freq: Once | INTRAMUSCULAR | Status: AC
  Administered 2024-06-12: 5 mL via INTRADERMAL
  Filled 2024-06-12: qty 5

## 2024-06-12 NOTE — ED Notes (Signed)
 Dc instructions reviewed with pt no questions or concerns will follow up with pcp to have suture removed.

## 2024-06-12 NOTE — ED Provider Notes (Signed)
 [facility] Provider Note    Event Date/Time   First MD Initiated Contact with Patient 06/12/24 1535     (approximate)   History   Laceration and Finger Injury   HPI  Sean Khan is a 43 y.o. male history of substance abuse, pericarditis presents emergency department with a laceration to the left index finger.  Patient was using a pocket knife to cut a zip tie.  They were at a [employer] trying to put flowers down.  States the knife slipped and cut his finger.  Unknown last Tdap.  Can still move his finger.  Patient right-handed      Physical Exam   Triage Vital Signs: ED Triage Vitals  Encounter Vitals Group     BP 06/12/24 1449 (!) 144/89     Girls Systolic BP Percentile --      Girls Diastolic BP Percentile --      Boys Systolic BP Percentile --      Boys Diastolic BP Percentile --      Pulse Rate 06/12/24 1449 78     Resp 06/12/24 1449 20     Temp 06/12/24 1449 98.9 F (37.2 C)     Temp Source 06/12/24 1449 Oral     SpO2 06/12/24 1449 100 %     Weight 06/12/24 1451 185 lb (83.9 kg)     Height 06/12/24 1451 5' 9 (1.753 m)     Head Circumference --      Peak Flow --      Pain Score 06/12/24 1449 10     Pain Loc --      Pain Education --      Exclude from Growth Chart --     Most recent vital signs: Vitals:   06/12/24 1449 06/12/24 1643  BP: (!) 144/89 (!) 140/79  Pulse: 78 70  Resp: 20 18  Temp: 98.9 F (37.2 C) 98.5 F (36.9 C)  SpO2: 100% 100%     General: Awake, no distress.   CV:  Good peripheral perfusion.  Resp:  Normal effort.  Abd:  No distention.   Other:  Left index finger with laceration across the dorsum, full range of motion of the finger, neurovascular intact, no tendon injury noted   ED Results / Procedures / Treatments   Labs (all labs ordered are listed, but only abnormal results are displayed) Labs Reviewed - No data to display   EKG     RADIOLOGY X-ray of the left index  finger    PROCEDURES:   .Laceration Repair  Date/Time: 06/12/2024 4:57 PM  Performed by: Gasper Devere ORN, PA-C Authorized by: Gasper Devere ORN, PA-C   Consent:    Consent obtained:  Verbal   Consent given by:  Patient   Risks, benefits, and alternatives were discussed: yes     Risks discussed:  Infection, pain, retained foreign body, tendon damage, poor cosmetic result, need for additional repair, nerve damage, poor wound healing and vascular damage   Alternatives discussed:  No treatment Universal protocol:    Procedure explained and questions answered to patient or proxy's satisfaction: yes     Immediately prior to procedure, a time out was called: yes     Patient identity confirmed:  Verbally with patient Anesthesia:    Anesthesia method:  Nerve block   Block location:  Left index finger   Block anesthetic:  Lidocaine  1% w/o epi   Block technique:  Digital   Block injection procedure:  Anatomic  landmarks identified, introduced needle, incremental injection, anatomic landmarks palpated and negative aspiration for blood   Block outcome:  Anesthesia achieved Laceration details:    Location:  Finger   Finger location:  L index finger   Length (cm):  2 Pre-procedure details:    Preparation:  Patient was prepped and draped in usual sterile fashion and imaging obtained to evaluate for foreign bodies Exploration:    Limited defect created (wound extended): no     Hemostasis achieved with:  Direct pressure   Imaging obtained: x-ray     Imaging outcome: foreign body not noted     Wound exploration: wound explored through full range of motion and entire depth of wound visualized     Wound extent: areolar tissue not violated, fascia not violated, no foreign body, no signs of injury, no nerve damage, no tendon damage, no underlying fracture and no vascular damage     Contaminated: no   Treatment:    Area cleansed with:  Povidone-iodine and saline   Amount of cleaning:  Standard    Irrigation solution:  Sterile saline   Irrigation method:  Tap   Debridement:  None   Undermining:  None   Scar revision: no   Skin repair:    Repair method:  Sutures   Suture size:  5-0   Suture material:  Nylon   Suture technique:  Simple interrupted   Number of sutures:  7 Approximation:    Approximation:  Close Repair type:    Repair type:  Simple Post-procedure details:    Dressing:  Non-adherent dressing   Procedure completion:  Tolerated well, no immediate complications Comments:     Sutures by Ronita, PA student under my supervision   Critical Care:  no Chief Complaint  Patient presents with   Laceration   Finger Injury      MEDICATIONS ORDERED IN ED: Medications  lidocaine  (PF) (XYLOCAINE ) 1 % injection 5 mL (5 mLs Intradermal Given 06/12/24 1547)  Tdap (BOOSTRIX ) injection 0.5 mL (0.5 mLs Intramuscular Given 06/12/24 1545)     IMPRESSION / MDM / ASSESSMENT AND PLAN / ED COURSE  I reviewed the triage vital signs and the nursing notes.                              Differential diagnosis includes, but is not limited to, laceration, tendon injury, avulsion, fracture  Patient's presentation is most consistent with acute complicated illness / injury requiring diagnostic workup.    Medications given: Xylocaine  for digital block, Tdap updated  X-ray left index finger independently reviewed interpreted by me as being negative for any acute abnormality  I did explain these findings to the patient.  See procedure note for laceration repair.  Patient tolerated procedure well.  Dressing was applied by nursing staff.  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  Explained to him that wearing the gloves would be just like having them in water and the area would not heal.  He is to have sutures removed in 7 to 10 days.  Return to emergency department for any sign of infection.  Patient is in agreement treatment  plan.  Discharged stable condition.      FINAL CLINICAL IMPRESSION(S) / ED DIAGNOSES   Final diagnoses:  Laceration of left index finger without foreign body without damage to nail, initial encounter  Rx / DC Orders   ED Discharge Orders     None        Note:  This document was prepared using Dragon voice recognition software and may include unintentional dictation errors.    Gasper Devere ORN, PA-C 06/12/24 1700    Jacolyn Pae, MD 06/12/24 [phone]

## 2024-06-12 NOTE — Discharge Instructions (Signed)
 Keep the areas clean and dry as possible. Change the bandage in 24 hours Tylenol  or ibuprofen  for pain as needed Sutures should be removed in 7 to 10 days

## 2024-06-12 NOTE — ED Triage Notes (Signed)
 Pt to ED for lac to L index finger from pocket knife while cutting a zip tie. Unknown last Tdap. Laceration is deep and about 1 inch long, skin not approximated.

## 2024-06-21 ENCOUNTER — Other Ambulatory Visit: Payer: Self-pay

## 2024-06-21 ENCOUNTER — Emergency Department (HOSPITAL_BASED_OUTPATIENT_CLINIC_OR_DEPARTMENT_OTHER)
Admission: EM | Admit: 2024-06-21 | Discharge: 2024-06-22 | Disposition: A | Discharge status: Home / Self Care | Payer: Self-pay | Attending: Emergency Medicine | Admitting: Emergency Medicine

## 2024-06-21 ENCOUNTER — Encounter (HOSPITAL_BASED_OUTPATIENT_CLINIC_OR_DEPARTMENT_OTHER): Payer: Self-pay

## 2024-06-21 DIAGNOSIS — X58XXXD Exposure to other specified factors, subsequent encounter: Secondary | ICD-10-CM | POA: Insufficient documentation

## 2024-06-21 DIAGNOSIS — Z5189 Encounter for other specified aftercare: Secondary | ICD-10-CM | POA: Insufficient documentation

## 2024-06-21 DIAGNOSIS — S61201D Unspecified open wound of left index finger without damage to nail, subsequent encounter: Secondary | ICD-10-CM | POA: Insufficient documentation

## 2024-06-21 NOTE — Discharge Instructions (Signed)
 I agree with your assessment, there is a small dehiscence (opening) where you had taken some sutures out already.  I would give it another 3 to 4 days to continue healing before suture removal.  Please return for suture removal and wound recheck at that time.  Continue cleaning and keeping the wound covered, it is okay to apply Polysporin, Neosporin.  Please return if you have significant worsening redness, swelling, pain, or pus draining from the affected site.

## 2024-06-21 NOTE — ED Triage Notes (Signed)
 Pt presents via POV for suture removal in left index finger. Sutures placed on 8/16

## 2024-06-21 NOTE — ED Provider Notes (Signed)
 [place] EMERGENCY DEPARTMENT AT [facility] Provider Note   CSN: [id number] Arrival date & time: 06/21/24  2324     Patient presents with: Suture / Staple Removal   Sean Khan is a 43 y.o. male with overall noncontributory past medical history who presents with concern for suture removal of left index finger, sutures placed on 8/16.  Patient reports that he removed 2 sutures at home and was concerned because the wound was just a little bit open where he tried to remove the sutures.  He is wondering whether they may need a little bit more time.  He denies any increasing pain, pus, redness, warmth.    Suture / Staple Removal       Prior to Admission medications   Not on File    Allergies: Patient has no known allergies.    Review of Systems  All other systems reviewed and are negative.   Updated Vital Signs BP (!) 146/101   Pulse 64   Temp 98.4 F (36.9 C) (Oral)   Resp 18   SpO2 100%   Physical Exam Vitals and nursing note reviewed.  Constitutional:      General: He is not in acute distress.    Appearance: Normal appearance.  HENT:     Head: Normocephalic and atraumatic.  Eyes:     General:        Right eye: No discharge.        Left eye: No discharge.  Cardiovascular:     Rate and Rhythm: Normal rate and regular rhythm.  Pulmonary:     Effort: Pulmonary effort is normal. No respiratory distress.  Musculoskeletal:        General: No deformity.  Skin:    General: Skin is warm and dry.     Comments: Sutures in place and wound healing appropriately on the left index finger dorsum, however there is a small amount of dehiscence at the site where patient removed 2 sutures on his own.  No drainage, no redness, no warmth.  Neurological:     Mental Status: He is alert and oriented to person, place, and time.  Psychiatric:        Mood and Affect: Mood normal.        Behavior: Behavior normal.     (all labs ordered are listed, but only abnormal  results are displayed) Labs Reviewed - No data to display  EKG: None  Radiology: No results found.   Procedures   Medications Ordered in the ED - No data to display                                  Medical Decision Making  Patient presents for suture removal/wound check.  He tried to remove 2 sutures on his own.  There is a small amount of dehiscence at site of the sutures that he removed.  No evidence of secondary infection, warmth, redness, other than mild dehiscence the wound does appear to be healing appropriately.  Given the small amount of dehiscence I do think reasonable to leave the sutures in place for another 3 to 5 days.  Patient instructed to return for suture removal again in the future.  Instructed to continue cleaning the wound as directed.  Return precautions for signs of developing infection were given.  Final diagnoses:  Encounter for wound re-check    ED Discharge Orders     None  Rosan Sherlean DEL, PA-C 06/22/24 0002    Jerrol Agent, MD 06/23/24 1343

## 2024-07-14 ENCOUNTER — Other Ambulatory Visit: Payer: Self-pay

## 2024-07-14 ENCOUNTER — Emergency Department (HOSPITAL_BASED_OUTPATIENT_CLINIC_OR_DEPARTMENT_OTHER): Payer: Worker's Compensation | Admitting: Radiology

## 2024-07-14 ENCOUNTER — Encounter (HOSPITAL_BASED_OUTPATIENT_CLINIC_OR_DEPARTMENT_OTHER): Payer: Self-pay

## 2024-07-14 ENCOUNTER — Emergency Department (HOSPITAL_BASED_OUTPATIENT_CLINIC_OR_DEPARTMENT_OTHER)
Admission: EM | Admit: 2024-07-14 | Discharge: 2024-07-14 | Disposition: A | Discharge status: Home / Self Care | Payer: Worker's Compensation | Attending: Emergency Medicine | Admitting: Emergency Medicine

## 2024-07-14 DIAGNOSIS — M20011 Mallet finger of right finger(s): Secondary | ICD-10-CM | POA: Insufficient documentation

## 2024-07-14 DIAGNOSIS — M79644 Pain in right finger(s): Secondary | ICD-10-CM | POA: Diagnosis present

## 2024-07-14 NOTE — Discharge Instructions (Signed)
 Your x-ray looks okay.  I think you tore a ligament in your finger.  We have placed you in a splint.  Please follow-up with a hand surgeon in the office.

## 2024-07-14 NOTE — ED Triage Notes (Signed)
 Pt presents via POV c/o injury to right middle finger at work.

## 2024-07-14 NOTE — ED Provider Notes (Signed)
 [place] EMERGENCY DEPARTMENT AT [facility] Provider Note   CSN: [id number] Arrival date & time: 07/14/24  9354     Patient presents with: Finger Injury   Sean Khan is a 43 y.o. male.   43 yo M with a cc of right middle finger pain.  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  He felt like the tension was bad and his finger got pulled into the roller.  Since then he has been unable to straighten his finger. He denies any other specific injury.        Prior to Admission medications   Not on File    Allergies: Patient has no known allergies.    Review of Systems  Updated Vital Signs BP (!) 128/92   Pulse 84   Temp 98.3 F (36.8 C) (Oral)   Resp 18   SpO2 100%   Physical Exam Vitals and nursing note reviewed.  Constitutional:      Appearance: He is well-developed.  HENT:     Head: Normocephalic and atraumatic.  Eyes:     Pupils: Pupils are equal, round, and reactive to light.  Neck:     Vascular: No JVD.  Cardiovascular:     Rate and Rhythm: Normal rate and regular rhythm.     Heart sounds: No murmur heard.    No friction rub. No gallop.  Pulmonary:     Effort: No respiratory distress.     Breath sounds: No wheezing.  Abdominal:     General: There is no distension.     Tenderness: There is no abdominal tenderness. There is no guarding or rebound.  Musculoskeletal:        General: Normal range of motion.     Cervical back: Normal range of motion and neck supple.     Comments: Patient's DIP of the right middle finger is unable to be straightened actively.  Passively I am able to straighten it without issue.  Otherwise able to range the digit.  No obvious tenderness about the bony structures.  No injuries to the nail.  Skin:    Coloration: Skin is not pale.     Findings: No rash.  Neurological:     Mental Status: He is alert and oriented to person, place, and time.   Psychiatric:        Behavior: Behavior normal.     (all labs ordered are listed, but only abnormal results are displayed) Labs Reviewed - No data to display  EKG: None  Radiology: DG Finger Middle Right Result Date: 07/14/2024 CLINICAL DATA:  Right middle finger injury at work. EXAM: RIGHT MIDDLE FINGER 2+V COMPARISON:  None Available. FINDINGS: There is no evidence of fracture or dislocation. There is no evidence of arthropathy or other focal bone abnormality. There is mild generalized soft tissue swelling along the middle finger. Three views were provided for this study. IMPRESSION: Soft tissue swelling without evidence of fractures. Electronically Signed   By: Sean Khan M.D.   On: 07/14/2024 07:27     Procedures   Medications Ordered in the ED - No data to display                                  Medical Decision Making Amount and/or Complexity of Data Reviewed Radiology: ordered.   43 yo M with a cc  of R middle finger pain.  Patient has mallet finger clinically.  Plain film of the finger independently interpreted by me without fracture.  Placed in a splint.  Hand follow-up.  7:36 AM:  I have discussed the diagnosis/risks/treatment options with the patient.  Evaluation and diagnostic testing in the emergency department does not suggest an emergent condition requiring admission or immediate intervention beyond what has been performed at this time.  They will follow up with Hand. We also discussed returning to the ED immediately if new or worsening sx occur. We discussed the sx which are most concerning (e.g., sudden worsening pain, fever, inability to tolerate by mouth) that necessitate immediate return. Medications administered to the patient during their visit and any new prescriptions provided to the patient are listed below.  Medications given during this visit Medications - No data to display   The patient appears reasonably screen and/or stabilized for discharge  and I doubt any other medical condition or other [name] requiring further screening, evaluation, or treatment in the ED at this time prior to discharge.       Final diagnoses:  Mallet finger of right hand    ED Discharge Orders     None          Sean Khan, [place] 07/14/24 [phone]
